# Patient Record
Sex: Female | Born: 1965
Health system: Southern US, Community
[De-identification: ages and names within clinical notes are randomized; demographics above are authoritative.]

## PROBLEM LIST (undated history)

## (undated) DIAGNOSIS — Z923 Personal history of irradiation: Secondary | ICD-10-CM

## (undated) DIAGNOSIS — E079 Disorder of thyroid, unspecified: Secondary | ICD-10-CM

## (undated) DIAGNOSIS — F419 Anxiety disorder, unspecified: Secondary | ICD-10-CM

## (undated) DIAGNOSIS — I10 Essential (primary) hypertension: Secondary | ICD-10-CM

## (undated) DIAGNOSIS — E559 Vitamin D deficiency, unspecified: Secondary | ICD-10-CM

## (undated) DIAGNOSIS — M2550 Pain in unspecified joint: Secondary | ICD-10-CM

## (undated) DIAGNOSIS — F32A Depression, unspecified: Secondary | ICD-10-CM

## (undated) DIAGNOSIS — E7800 Pure hypercholesterolemia, unspecified: Secondary | ICD-10-CM

## (undated) DIAGNOSIS — G4700 Insomnia, unspecified: Secondary | ICD-10-CM

## (undated) DIAGNOSIS — M1990 Unspecified osteoarthritis, unspecified site: Secondary | ICD-10-CM

## (undated) DIAGNOSIS — K219 Gastro-esophageal reflux disease without esophagitis: Secondary | ICD-10-CM

## (undated) DIAGNOSIS — E669 Obesity, unspecified: Secondary | ICD-10-CM

## (undated) DIAGNOSIS — E119 Type 2 diabetes mellitus without complications: Secondary | ICD-10-CM

## (undated) DIAGNOSIS — D649 Anemia, unspecified: Secondary | ICD-10-CM

## (undated) DIAGNOSIS — F329 Major depressive disorder, single episode, unspecified: Secondary | ICD-10-CM

## (undated) HISTORY — DX: Major depressive disorder, single episode, unspecified: F32.9

## (undated) HISTORY — DX: Anxiety disorder, unspecified: F41.9

## (undated) HISTORY — PX: TUBAL LIGATION: SHX77

## (undated) HISTORY — DX: Pain in unspecified joint: M25.50

## (undated) HISTORY — DX: Insomnia, unspecified: G47.00

## (undated) HISTORY — DX: Unspecified osteoarthritis, unspecified site: M19.90

## (undated) HISTORY — PX: ABDOMINAL HYSTERECTOMY: SHX81

## (undated) HISTORY — DX: Anemia, unspecified: D64.9

## (undated) HISTORY — DX: Vitamin D deficiency, unspecified: E55.9

## (undated) HISTORY — DX: Pure hypercholesterolemia, unspecified: E78.00

## (undated) HISTORY — DX: Obesity, unspecified: E66.9

## (undated) HISTORY — DX: Type 2 diabetes mellitus without complications: E11.9

## (undated) HISTORY — DX: Disorder of thyroid, unspecified: E07.9

## (undated) HISTORY — PX: OTHER SURGICAL HISTORY: SHX169

## (undated) HISTORY — DX: Essential (primary) hypertension: I10

## (undated) HISTORY — DX: Personal history of irradiation: Z92.3

## (undated) HISTORY — DX: Gastro-esophageal reflux disease without esophagitis: K21.9

## (undated) HISTORY — DX: Depression, unspecified: F32.A

---

## 2003-06-05 ENCOUNTER — Other Ambulatory Visit: Payer: Self-pay

## 2007-06-19 ENCOUNTER — Ambulatory Visit: Payer: Self-pay | Admitting: Family Medicine

## 2007-08-24 ENCOUNTER — Ambulatory Visit: Payer: Self-pay | Admitting: Family Medicine

## 2007-09-28 ENCOUNTER — Ambulatory Visit: Payer: Self-pay | Admitting: Specialist

## 2009-03-27 ENCOUNTER — Ambulatory Visit: Payer: Self-pay | Admitting: Family Medicine

## 2010-04-27 LAB — HM PAP SMEAR: HM PAP: NORMAL

## 2010-05-28 ENCOUNTER — Ambulatory Visit: Payer: Self-pay | Admitting: Family Medicine

## 2010-06-25 ENCOUNTER — Ambulatory Visit: Payer: Self-pay | Admitting: Family Medicine

## 2013-02-26 ENCOUNTER — Ambulatory Visit: Payer: Self-pay | Admitting: Obstetrics and Gynecology

## 2013-02-26 LAB — BASIC METABOLIC PANEL
Anion Gap: 3 — ABNORMAL LOW (ref 7–16)
BUN: 11 mg/dL (ref 7–18)
Co2: 30 mmol/L (ref 21–32)
EGFR (African American): >60
EGFR (Non-African Amer.): >60
Glucose: 82 mg/dL (ref 65–99)
Osmolality: 274 (ref 275–301)

## 2013-02-26 LAB — PREGNANCY, URINE: Pregnancy Test, Urine: NEGATIVE m[IU]/mL

## 2013-02-26 LAB — WBC: WBC: 8.5 10*3/uL (ref 3.6–11.0)

## 2013-02-26 LAB — HEMOGLOBIN: HGB: 11.8 g/dL — ABNORMAL LOW (ref 12.0–16.0)

## 2013-05-17 ENCOUNTER — Emergency Department: Payer: Self-pay | Admitting: Emergency Medicine

## 2013-05-19 LAB — BETA STREP CULTURE(ARMC)

## 2013-05-29 DIAGNOSIS — Z86018 Personal history of other benign neoplasm: Secondary | ICD-10-CM | POA: Insufficient documentation

## 2013-07-04 LAB — HM MAMMOGRAPHY: HM Mammogram: NORMAL

## 2013-07-15 DIAGNOSIS — Z9889 Other specified postprocedural states: Secondary | ICD-10-CM | POA: Insufficient documentation

## 2013-12-26 LAB — LIPID PANEL
Cholesterol: 214 mg/dL — AB (ref 0–200)
HDL: 93 mg/dL — AB (ref 35–70)
LDL CALC: 109 mg/dL
Triglycerides: 62 mg/dL (ref 40–160)

## 2014-07-10 ENCOUNTER — Ambulatory Visit (INDEPENDENT_AMBULATORY_CARE_PROVIDER_SITE_OTHER): Payer: BLUE CROSS/BLUE SHIELD | Admitting: Podiatry

## 2014-07-10 ENCOUNTER — Ambulatory Visit (INDEPENDENT_AMBULATORY_CARE_PROVIDER_SITE_OTHER): Payer: BLUE CROSS/BLUE SHIELD

## 2014-07-10 ENCOUNTER — Encounter: Payer: Self-pay | Admitting: Podiatry

## 2014-07-10 VITALS — BP 183/110 | HR 60 | Resp 16 | Ht 66.0 in | Wt 195.0 lb

## 2014-07-10 DIAGNOSIS — M8430XA Stress fracture, unspecified site, initial encounter for fracture: Secondary | ICD-10-CM

## 2014-07-10 DIAGNOSIS — M779 Enthesopathy, unspecified: Secondary | ICD-10-CM | POA: Diagnosis not present

## 2014-07-10 NOTE — Patient Instructions (Signed)
Metatarsal Stress Fracture When too much stress is put on the foot, as in running and jumping sports, the center shaft of the bones of the forefoot is very susceptible to stress fractures (break in bone). This is because of repetitive stress on the bone. This injury is more common if osteoporosis is present or if inadequate running shoes are used. Rapid increase in running distances are often the cause. Running distances should be gradually increased to avoid this problem. Shoes should be used which adequately cushion the foot. Shoes should absorb the shocks of the activity.  DIAGNOSIS  Usually the diagnosis is made by history. The foot progressively becomes sorer with activities. X-rays may be negative (show no break) within the first 2 to 3 weeks of the beginning of pain. A later X-ray may show signs of healing bone (callus formation). A bone scan or MRI will usually make the diagnosis earlier. TREATMENT AND HOME CARE INSTRUCTIONS  Treatment may or may not include a cast, removable fracture boot, or walking shoe. Casts are used for short periods of time to prevent muscle atrophy (muscle wasting).  Activities should be stopped until further advised by your caregiver.  Wear shoes with adequate shock absorbing abilities.  Alternative exercise may be undertaken while waiting for healing. These may include bicycling and swimming, or as your caregiver suggests. If you do not have a cast or splint:  You may walk on your injured foot as tolerated or advised.  Do not put any weight on your injured foot for as long as directed by your caregiver. Slowly increase the amount of time you walk on the foot as the pain allows or as advised.  Use crutches until you can bear weight without pain. A gradual increase in weight bearing may help.  Apply ice to the injury for 15-20 minutes each hour while awake for the first 2 days. Put the ice in a plastic bag and place a towel between the bag of ice and your  skin.  Only take over-the-counter or prescription medicines for pain, discomfort, or fever as directed by your caregiver. SEEK IMMEDIATE MEDICAL CARE IF:   Pain is becoming worse rather than better, or if pain is uncontrolled with medications.  You have increased swelling or redness in the foot. MAKE SURE YOU:   Understand these instructions.  Will watch your condition.  Will get help right away if you are not doing well or get worse. Document Released: 04/08/2000 Document Revised: 07/04/2011 Document Reviewed: 02/05/2008 [employer] Patient Information 2015 [employer]. This information is not intended to replace advice given to you by your health care provider. Make sure you discuss any questions you have with your health care provider.  

## 2014-07-10 NOTE — Progress Notes (Signed)
   Subjective:    Patient ID: Shelia Vasquez, female    DOB: [birth year], 49 y.o.   MRN: [id number]  HPI  49 year old female presents the office they with complaints of right foot pain and swelling which has been ongoing for the last 3 weeks. She states that prior to onset of symptoms she is started a new exercise program which she has been walking and uses a stationary bike. She states that she has had increased pain overlying the foot for which she points to the dorsal forefoot. She states that she has pain with walking and pressure. She's had no prior treatment. She also states that she has pain to her ankles upon direct pressure however she did not have any pain to her ankles with ambulation on a regular basis. She states that she has stiffness in her ankles which is mostly in the morning. She also states that she has stiffness in other joints it is her arms and hands. She is cruising seen her primary care physician for this was referred to rheumatology for which she has an upcoming coming appointment. No other complaints at this time.    Review of Systems  All other systems reviewed and are negative.      Objective:   Physical Exam AAO 3, NAD DP/PT pulses palpable, CRT less than 3 seconds Protective sensation intact with [place],, vibratory sensation intact, Achilles tendon reflex intact. There is tenderness to palpation overlying the right second metatarsal distally. There is no other area specific pinpoint bony tenderness or pain with vibratory sensation. There is moderate edema along the dorsal aspect of the right foot. There is no specific area pinpoint bony tenderness or pain with vibratory sensation overlying the ankle bilaterally. Ankle joint range of motion is intact. No edema to the ankles bilaterally. MMT tightly decreased on the right due to pain. ROM WNL No open lesions or pre-ulcer lesions identified. No pain with calf compression, swelling, warmth,  erythema.       Assessment & Plan:  49 year old female right second metatarsal stress fracture; multiple joint stiffness -X-rays were obtained and reviewed. X-rays revealed periosteal reaction overlying the second metatarsal distally. -Treatment options were discussed the patient including alternatives, risks, complications. -Given clinical and x-ray findings recommend immobilization. CAM walker was dispensed. Can ambulate as tolerated in the cam boot. -For the multiple joint pain and morning stiffness recommend follow-up with rheumatology. -Follow-up in 2 weeks for repeat x-rays or sooner if any problems are to arise. In the meantime encouraged to call the office with any questions, concerns, changes symptoms.

## 2014-07-17 ENCOUNTER — Ambulatory Visit: Payer: Self-pay | Admitting: Podiatry

## 2014-07-24 ENCOUNTER — Ambulatory Visit: Payer: BLUE CROSS/BLUE SHIELD | Admitting: Podiatry

## 2014-10-09 ENCOUNTER — Other Ambulatory Visit: Payer: Self-pay | Admitting: Family Medicine

## 2014-10-10 NOTE — Telephone Encounter (Signed)
Patient requesting refill. 

## 2014-11-12 ENCOUNTER — Other Ambulatory Visit: Payer: Self-pay | Admitting: Family Medicine

## 2014-11-13 NOTE — Telephone Encounter (Signed)
Patient requesting refill. 

## 2015-02-01 ENCOUNTER — Other Ambulatory Visit: Payer: Self-pay | Admitting: Family Medicine

## 2015-02-02 NOTE — Telephone Encounter (Signed)
Left voice message for her to return call to schedule appointment

## 2015-02-04 NOTE — Telephone Encounter (Signed)
Appointment made for 02-06-15

## 2015-02-06 ENCOUNTER — Ambulatory Visit (INDEPENDENT_AMBULATORY_CARE_PROVIDER_SITE_OTHER): Payer: BLUE CROSS/BLUE SHIELD | Admitting: Family Medicine

## 2015-02-06 ENCOUNTER — Encounter: Payer: Self-pay | Admitting: Family Medicine

## 2015-02-06 VITALS — BP 122/86 | HR 64 | Temp 98.7°F | Resp 16 | Ht 66.0 in | Wt 195.2 lb

## 2015-02-06 DIAGNOSIS — Z862 Personal history of diseases of the blood and blood-forming organs and certain disorders involving the immune mechanism: Secondary | ICD-10-CM | POA: Diagnosis not present

## 2015-02-06 DIAGNOSIS — E785 Hyperlipidemia, unspecified: Secondary | ICD-10-CM | POA: Diagnosis not present

## 2015-02-06 DIAGNOSIS — F418 Other specified anxiety disorders: Secondary | ICD-10-CM

## 2015-02-06 DIAGNOSIS — E890 Postprocedural hypothyroidism: Secondary | ICD-10-CM | POA: Diagnosis not present

## 2015-02-06 DIAGNOSIS — F329 Major depressive disorder, single episode, unspecified: Secondary | ICD-10-CM

## 2015-02-06 DIAGNOSIS — G4700 Insomnia, unspecified: Secondary | ICD-10-CM

## 2015-02-06 DIAGNOSIS — J302 Other seasonal allergic rhinitis: Secondary | ICD-10-CM

## 2015-02-06 DIAGNOSIS — Z23 Encounter for immunization: Secondary | ICD-10-CM | POA: Diagnosis not present

## 2015-02-06 DIAGNOSIS — Z923 Personal history of irradiation: Secondary | ICD-10-CM | POA: Insufficient documentation

## 2015-02-06 DIAGNOSIS — K5909 Other constipation: Secondary | ICD-10-CM | POA: Insufficient documentation

## 2015-02-06 DIAGNOSIS — E669 Obesity, unspecified: Secondary | ICD-10-CM | POA: Insufficient documentation

## 2015-02-06 DIAGNOSIS — Z114 Encounter for screening for human immunodeficiency virus [HIV]: Secondary | ICD-10-CM | POA: Diagnosis not present

## 2015-02-06 DIAGNOSIS — K219 Gastro-esophageal reflux disease without esophagitis: Secondary | ICD-10-CM

## 2015-02-06 DIAGNOSIS — E559 Vitamin D deficiency, unspecified: Secondary | ICD-10-CM | POA: Diagnosis not present

## 2015-02-06 DIAGNOSIS — F32A Depression, unspecified: Secondary | ICD-10-CM

## 2015-02-06 DIAGNOSIS — K5900 Constipation, unspecified: Secondary | ICD-10-CM

## 2015-02-06 DIAGNOSIS — F419 Anxiety disorder, unspecified: Secondary | ICD-10-CM

## 2015-02-06 DIAGNOSIS — I10 Essential (primary) hypertension: Secondary | ICD-10-CM | POA: Insufficient documentation

## 2015-02-06 DIAGNOSIS — Z90710 Acquired absence of both cervix and uterus: Secondary | ICD-10-CM | POA: Insufficient documentation

## 2015-02-06 MED ORDER — FLUTICASONE PROPIONATE 50 MCG/ACT NA SUSP: 2.0000 | Freq: Every day | NASAL | Status: DC

## 2015-02-06 MED ORDER — LINACLOTIDE 145 MCG PO CAPS: 145.0000 ug | ORAL_CAPSULE | Freq: Every day | ORAL | Status: DC

## 2015-02-06 MED ORDER — CLONIDINE HCL 0.1 MG PO TABS: 0.1000 mg | ORAL_TABLET | Freq: Every evening | ORAL | Status: DC

## 2015-02-06 MED ORDER — PRAVASTATIN SODIUM 40 MG PO TABS: 40.0000 mg | ORAL_TABLET | Freq: Every day | ORAL | Status: DC

## 2015-02-06 MED ORDER — ATENOLOL 50 MG PO TABS: 50.0000 mg | ORAL_TABLET | Freq: Every day | ORAL | Status: DC

## 2015-02-06 MED ORDER — AMLODIPINE BESYLATE 5 MG PO TABS: 5.0000 mg | ORAL_TABLET | Freq: Every day | ORAL | Status: DC

## 2015-02-06 MED ORDER — TEMAZEPAM 15 MG PO CAPS: 15.0000 mg | ORAL_CAPSULE | Freq: Every evening | ORAL | Status: DC | PRN

## 2015-02-06 NOTE — Progress Notes (Signed)
Name: Shelia Vasquez   MRN: [id number]    DOB: [birth year]   Date:02/06/2015       Progress Note  Subjective  Chief Complaint  Chief Complaint  Patient presents with  . Medication Refill  . Hypertension  . Hyperlipidemia  . Hypothyroidism    HPI  HTN: taking medication and denies side effects of medication, no chest pain, no palpitation.   Hyperlipidemia: taking Pravastatin, she has some muscle aches, but not sure if related to medication.   Hypothyroidism: s/p thyroid ablation. Taking Synthroid, she has noticed some hair loss, she also has constipation, mild dry skin  Seasonal allergic rhinitis: she has congestion this time of the year, also itchy nose and rhinorrhea, she does not take otc medication because of bp , states she responded well on Fluticasone in the past  Chronic Constipation: she states had it for many years, worse over the past 6 years. Tried multiple laxatives at home, and also Miralax without improvement. She has occasional abdominal pain, no blood in stools, no family history of colon cancer. She needs to strain, bowel movements with medication about once every two weeks.   Insomnia: not on medication , but has difficulty falling asleep and staying asleep at least 4 times weekly. She snores, feels tired during the day, has a lot of nasal congestion   Patient Active Problem List   Diagnosis Date Noted  . Anxiety and depression 02/06/2015  . Benign hypertension 02/06/2015  . Dyslipidemia 02/06/2015  . Insomnia 02/06/2015  . Arthralgia 02/06/2015  . Gastro-esophageal reflux disease without esophagitis 02/06/2015  . History of radioactive iodine thyroid ablation 02/06/2015  . Hypothyroidism, postablative 02/06/2015  . History of iron deficiency anemia 02/06/2015  . Obesity (BMI 30.0-34.9) 02/06/2015  . H/O: hysterectomy 02/06/2015  . Vitamin D deficiency 02/06/2015  . Seasonal allergic rhinitis 02/06/2015  . Post-operative state 07/15/2013  . History of  uterine fibroid 05/29/2013    Past Surgical History  Procedure Laterality Date  . Abdominal hysterectomy    . Cesarean section    . Btl    . Tubal ligation      History reviewed. No pertinent family history.  Social History   Social History  . Marital Status: Married    Spouse Name: N/A  . Number of Children: N/A  . Years of Education: N/A   Occupational History  . Not on file.   Social History Main Topics  . Smoking status: Never Smoker   . Smokeless tobacco: Never Used  . Alcohol Use: No  . Drug Use: No  . Sexual Activity:    Partners: Male   Other Topics Concern  . Not on file   Social History Narrative     Current outpatient prescriptions:  .  aspirin 81 MG tablet, Take 1 tablet by mouth daily., Disp: , Rfl:  .  loratadine (CLARITIN) 10 MG tablet, CLARITIN, 10MG  (Oral Tablet)  one po qday for 0 days  Quantity: 1.00;  Refills: 0   Ordered :27-Apr-2010  Shelia CorySOWLES, Adabelle Griffiths MD;  Started 05-Mar-2009 Active, Disp: , Rfl:  .  amLODipine (NORVASC) 5 MG tablet, Take 1 tablet (5 mg total) by mouth daily., Disp: 90 tablet, Rfl: 1 .  atenolol (TENORMIN) 50 MG tablet, Take 1 tablet (50 mg total) by mouth daily., Disp: 90 tablet, Rfl: 1 .  cloNIDine (CATAPRES) 0.1 MG tablet, Take 1 tablet (0.1 mg total) by mouth every evening., Disp: 90 tablet, Rfl: 1 .  fluticasone (FLONASE) 50 MCG/ACT nasal spray, Place  2 sprays into both nostrils daily., Disp: 16 g, Rfl: 6 .  levothyroxine (SYNTHROID, LEVOTHROID) 112 MCG tablet, TAKE 1 TABLET BY MOUTH EVERY DAY (PLEASE SCHEDULE APPT WITH OFFICE), Disp: 90 tablet, Rfl: 0 .  pravastatin (PRAVACHOL) 40 MG tablet, Take 1 tablet (40 mg total) by mouth daily., Disp: 90 tablet, Rfl: 1 .  ranitidine (ZANTAC) 150 MG capsule, Take by mouth., Disp: , Rfl:  .  temazepam (RESTORIL) 15 MG capsule, Take 1 capsule (15 mg total) by mouth at bedtime as needed for sleep., Disp: 30 capsule, Rfl: 2  No Known Allergies   ROS  Ten systems reviewed and is  negative except as mentioned in HPI  Objective  Filed Vitals:   02/06/15 1045  BP: 122/86  Pulse: 64  Temp: 98.7 F (37.1 C)  TempSrc: Oral  Resp: 16  Height:  (1.676 m)  Weight: 195 lb 3.2 oz (88.542 kg)  SpO2: 97%    Body mass index is 31.52 kg/(m^2).  Physical Exam  Constitutional: Patient appears well-developed  Obese  No distress.  HEENT: head atraumatic, normocephalic, pupils equal and reactive to light,  neck supple, throat within normal limits. Boggy turbinates Cardiovascular: Normal rate, regular rhythm and normal heart sounds.  No murmur heard. No BLE edema. Pulmonary/Chest: Effort normal and breath sounds normal. No respiratory distress. Abdominal: Soft.  There is no tenderness. Psychiatric: Patient has a normal mood and affect. behavior is normal. Judgment and thought content normal.  PHQ2/9: Depression screen PHQ 2/9 02/06/2015  Decreased Interest 0  Down, Depressed, Hopeless 0  PHQ - 2 Score 0    Fall Risk: Fall Risk  02/06/2015  Falls in the past year? No    Functional Status Survey: Is the patient deaf or have difficulty hearing?: No Does the patient have difficulty seeing, even when wearing glasses/contacts?: Yes (glasses) Does the patient have difficulty concentrating, remembering, or making decisions?: No Does the patient have difficulty walking or climbing stairs?: No Does the patient have difficulty dressing or bathing?: No Does the patient have difficulty doing errands alone such as visiting a doctor's office or shopping?: No    Assessment & Plan  1. Benign hypertension  At goal - amLODipine (NORVASC) 5 MG tablet; Take 1 tablet (5 mg total) by mouth daily.  Dispense: 90 tablet; Refill: 1 - atenolol (TENORMIN) 50 MG tablet; Take 1 tablet (50 mg total) by mouth daily.  Dispense: 90 tablet; Refill: 1 - cloNIDine (CATAPRES) 0.1 MG tablet; Take 1 tablet (0.1 mg total) by mouth every evening.  Dispense: 90 tablet; Refill: 1  2. Needs flu  shot  - Flu Vaccine QUAD 36+ mos PF IM (Fluarix & Fluzone Quad PF)  3. Gastro-esophageal reflux disease without esophagitis  Under control on prn medicatoin   4. Hypothyroidism, postablative  -TSH  5. Anxiety and depression  Doing well, no symptoms now  6. Dyslipidemia  - pravastatin (PRAVACHOL) 40 MG tablet; Take 1 tablet (40 mg total) by mouth daily.  Dispense: 90 tablet; Refill: 1 - Comprehensive metabolic panel - Lipid panel  7. History of iron deficiency anemia  - CBC with Differential/Platelet  8. Vitamin D deficiency  - Vit D  25 hydroxy (rtn osteoporosis monitoring)  9. Encounter for screening for HIV  - HIV antibody  10. Seasonal allergic rhinitis  - fluticasone (FLONASE) 50 MCG/ACT nasal spray; Place 2 sprays into both nostrils daily.  Dispense: 16 g; Refill: 6  11. Insomnia  - temazepam (RESTORIL) 15 MG capsule; Take 1  capsule (15 mg total) by mouth at bedtime as needed for sleep.  Dispense: 30 capsule; Refill: 2  12. Chronic constipation  Failed otc and Miralax we will try Linzess, caused possible side effect - Linaclotide (LINZESS) 145 MCG CAPS capsule; Take 1 capsule (145 mcg total) by mouth daily.  Dispense: 30 capsule; Refill: 5

## 2015-04-01 ENCOUNTER — Encounter: Payer: Self-pay | Admitting: Family Medicine

## 2015-04-01 ENCOUNTER — Ambulatory Visit (INDEPENDENT_AMBULATORY_CARE_PROVIDER_SITE_OTHER): Payer: BLUE CROSS/BLUE SHIELD | Admitting: Family Medicine

## 2015-04-01 VITALS — BP 122/86 | HR 84 | Temp 98.6°F | Resp 18 | Ht 66.0 in | Wt 194.5 lb

## 2015-04-01 DIAGNOSIS — R002 Palpitations: Secondary | ICD-10-CM | POA: Diagnosis not present

## 2015-04-01 DIAGNOSIS — M5410 Radiculopathy, site unspecified: Secondary | ICD-10-CM | POA: Diagnosis not present

## 2015-04-01 DIAGNOSIS — R072 Precordial pain: Secondary | ICD-10-CM

## 2015-04-01 DIAGNOSIS — R42 Dizziness and giddiness: Secondary | ICD-10-CM | POA: Diagnosis not present

## 2015-04-01 DIAGNOSIS — I10 Essential (primary) hypertension: Secondary | ICD-10-CM | POA: Diagnosis not present

## 2015-04-01 LAB — POCT URINALYSIS DIPSTICK
BILIRUBIN UA: NEGATIVE
Glucose, UA: NEGATIVE
KETONES UA: NEGATIVE
Leukocytes, UA: NEGATIVE
Nitrite, UA: NEGATIVE
PH UA: 5.5
PROTEIN UA: NEGATIVE
RBC UA: NEGATIVE
Spec Grav, UA: 1.02
Urobilinogen, UA: 0.2

## 2015-04-01 MED ORDER — METOPROLOL SUCCINATE ER 50 MG PO TB24: 50.0000 mg | ORAL_TABLET | Freq: Every day | ORAL | Status: DC

## 2015-04-01 MED ORDER — CYCLOBENZAPRINE HCL 10 MG PO TABS: 10.0000 mg | ORAL_TABLET | Freq: Three times a day (TID) | ORAL | Status: DC | PRN

## 2015-04-01 MED ORDER — METOPROLOL SUCCINATE ER 25 MG PO TB24: 25.0000 mg | ORAL_TABLET | Freq: Every day | ORAL | Status: DC

## 2015-04-01 NOTE — Patient Instructions (Signed)

## 2015-04-01 NOTE — Progress Notes (Signed)
Name: Shelia Vasquez   MRN: [id number]    DOB: 1965-05-17   Date:04/01/2015       Progress Note  Subjective  Chief Complaint  Chief Complaint  Patient presents with  . Back Pain    Onset couple of weeks, intermittent, lower back and radiates to side area and left arm-having numbness down arm. Stabbing, aching pain.  . Dizziness    Intermittently for the past couple of months, Amlodipine was making patient have bilateral ankle edema so patient stopped medication. Patient has noticed when she stands up too fast she will get dizzy.    HPI  Orthostatic dizziness: she was switched from Norvasc to Atenolol because of leg swelling back in September, since than she has noticed dizziness when she stands up. No syncope. Symptoms resolves within seconds. She states not difficulty walking while dizzy. No association with chest pain or palpitation. She takes Atenolol and Clonidine at night, no bp medications during the day.   Back pain: symptoms started gradually over the past couple of weeks. She has a history of similar symptoms in the past. Pain is intermittent. It is described as sharp/stabbing on lower mid back, and sometimes radiates to left flank area. No hematuria, no urinary frequency or dysuria. Occasionally has urinary urgency - but not a new symptoms. No fever, chills or rash. No change in bowel movements - constipation is controlled with Linzess.   Palpitation: happening at the end of the day, before she takes Atenolol in the evening. Symptoms resolves with Atenolol. She has hypothyroidism but did not get labs done on her last visit. She will go there today to get it drawn. Explained there are many reasons for palpitation, including hyperthyroidism, rhythm problems, anemia.  Chest pain: intermittent, started a couple of months ago. Symptoms are almost daily, lasts about 20 minutes, described as tightness, no association with diaphoresis or nausea, the pain does not radiate.    Patient  Active Problem List   Diagnosis Date Noted  . Anxiety and depression 02/06/2015  . Benign hypertension 02/06/2015  . Dyslipidemia 02/06/2015  . Insomnia 02/06/2015  . Arthralgia 02/06/2015  . Gastro-esophageal reflux disease without esophagitis 02/06/2015  . History of radioactive iodine thyroid ablation 02/06/2015  . Hypothyroidism, postablative 02/06/2015  . History of iron deficiency anemia 02/06/2015  . Obesity (BMI 30.0-34.9) 02/06/2015  . H/O: hysterectomy 02/06/2015  . Vitamin D deficiency 02/06/2015  . Seasonal allergic rhinitis 02/06/2015  . Chronic constipation 02/06/2015  . Post-operative state 07/15/2013  . History of uterine fibroid 05/29/2013    Past Surgical History  Procedure Laterality Date  . Abdominal hysterectomy    . Cesarean section    . Btl    . Tubal ligation      History reviewed. No pertinent family history.  Social History   Social History  . Marital Status: Married    Spouse Name: N/A  . Number of Children: N/A  . Years of Education: N/A   Occupational History  . Not on file.   Social History Main Topics  . Smoking status: Never Smoker   . Smokeless tobacco: Never Used  . Alcohol Use: No  . Drug Use: No  . Sexual Activity:    Partners: Male   Other Topics Concern  . Not on file   Social History Narrative     Current outpatient prescriptions:  .  aspirin 81 MG tablet, Take 1 tablet by mouth daily., Disp: , Rfl:  .  cloNIDine (CATAPRES) 0.1 MG tablet, Take  1 tablet (0.1 mg total) by mouth every evening., Disp: 90 tablet, Rfl: 1 .  fluticasone (FLONASE) 50 MCG/ACT nasal spray, Place 2 sprays into both nostrils daily., Disp: 16 g, Rfl: 6 .  levothyroxine (SYNTHROID, LEVOTHROID) 112 MCG tablet, TAKE 1 TABLET BY MOUTH EVERY DAY (PLEASE SCHEDULE APPT WITH OFFICE), Disp: 90 tablet, Rfl: 0 .  Linaclotide (LINZESS) 145 MCG CAPS capsule, Take 1 capsule (145 mcg total) by mouth daily., Disp: 30 capsule, Rfl: 5 .  loratadine (CLARITIN) 10  MG tablet, CLARITIN,  (Oral Tablet)  one po qday for 0 days  Quantity: 1.00;  Refills: 0   Ordered :27-Apr-2010  Alba Cory MD;  Started 05-Mar-2009 Active, Disp: , Rfl:  .  ranitidine (ZANTAC) 150 MG capsule, Take by mouth., Disp: , Rfl:  .  metoprolol succinate (TOPROL-XL) 50 MG 24 hr tablet, Take 1 tablet (50 mg total) by mouth daily. Take with or immediately following a meal., Disp: 90 tablet, Rfl: 0  No Known Allergies   ROS  Constitutional: Negative for fever or weight change.  Respiratory: Negative for cough and shortness of breath.   Cardiovascular: Positive for chest pain - intermittent left side, usually when in bed, positive for some  palpitations.  Gastrointestinal: Negative for abdominal pain, no bowel changes.  Musculoskeletal: Negative for gait problem or joint swelling.  Skin: Negative for rash.  Neurological: Positive for dizziness no  headache.  No other specific complaints in a complete review of systems (except as listed in HPI above).  Objective  Filed Vitals:   04/01/15 1000  BP: 122/86  Pulse: 84  Temp: 98.6 F (37 C)  TempSrc: Oral  Resp: 18  Height:  (1.676 m)  Weight: 194 lb 8 oz (88.225 kg)  SpO2: 96%    Body mass index is 31.41 kg/(m^2).  Physical Exam  Constitutional: Patient appears well-developed and well-nourished. Obese No distress.  HEENT: head atraumatic, normocephalic, pupils equal and reactive to light neck supple, throat within normal limits Cardiovascular: Normal rate, regular rhythm and normal heart sounds.  No murmur heard. No BLE edema. Pulmonary/Chest: Effort normal and breath sounds normal. No respiratory distress. Abdominal: Soft.  There is no tenderness. Psychiatric: Patient has a normal mood and affect. behavior is normal. Judgment and thought content normal.  Recent Results (from the past 2160 hour(s))  POCT Urinalysis Dipstick     Status: Normal   Collection Time: 04/01/15 10:04 AM  Result Value Ref Range    Color, UA yellow    Clarity, UA clear    Glucose, UA neg    Bilirubin, UA neg    Ketones, UA neg    Spec Grav, UA 1.020    Blood, UA neg    pH, UA 5.5    Protein, UA neg    Urobilinogen, UA 0.2    Nitrite, UA neg    Leukocytes, UA Negative Negative     PHQ2/9: Depression screen PHQ 2/9 02/06/2015  Decreased Interest 0  Down, Depressed, Hopeless 0  PHQ - 2 Score 0     Fall Risk: Fall Risk  02/06/2015  Falls in the past year? No    Assessment & Plan  1. Acute low back pain with radicular symptoms, duration less than 6 weeks  Seems to be muscular skeletal, we will give her home exercises for her back, muscle relaxer and if worsening a prednisone taper - POCT Urinalysis Dipstick We will try flexeril , start qhs and see how she tolerates #30 no refills  2. Benign hypertension  Switch to Toprol XL 25 mg since she has bradycardia  and monitor -Toprol XL 25 mg one daily #30 and no refills, return in one month  3. Orthostatic dizziness  Advised to get up slowly, bp is not that low right now, symptoms are mild  4. Palpitation  Change from Atenolol to Toprol, needs to get labs done  5. Precordial pain  No significant family history, discussed holding off to find out what TSH and CBC looks like but she is very anxious about it and would like referral now to cardiology - Ambulatory referral to Cardiology EKG - bradycardia rate of 50 and non-specific ST changes

## 2015-04-01 NOTE — Addendum Note (Signed)
Addended by: Marcos EkeGRAVES, Mahoganie Basher C on: 04/01/2015 11:11 AM   Modules accepted: Orders

## 2015-04-02 LAB — COMPREHENSIVE METABOLIC PANEL
ALBUMIN: 4.4 g/dL (ref 3.5–5.5)
ALK PHOS: 91 IU/L (ref 39–117)
ALT: 15 IU/L (ref 0–32)
AST: 23 IU/L (ref 0–40)
Albumin/Globulin Ratio: 1.3 (ref 1.1–2.5)
BILIRUBIN TOTAL: 0.2 mg/dL (ref 0.0–1.2)
BUN / CREAT RATIO: 18 (ref 9–23)
BUN: 13 mg/dL (ref 6–24)
CO2: 26 mmol/L (ref 18–29)
Calcium: 9.8 mg/dL (ref 8.7–10.2)
Chloride: 97 mmol/L (ref 97–106)
Creatinine, Ser: 0.73 mg/dL (ref 0.57–1.00)
GFR calc Af Amer: 113 mL/min/{1.73_m2} (ref >59)
GFR calc non Af Amer: 98 mL/min/{1.73_m2} (ref >59)
GLOBULIN, TOTAL: 3.5 g/dL (ref 1.5–4.5)
Glucose: 90 mg/dL (ref 65–99)
POTASSIUM: 3.6 mmol/L (ref 3.5–5.2)
SODIUM: 141 mmol/L (ref 136–144)
Total Protein: 7.9 g/dL (ref 6.0–8.5)

## 2015-04-02 LAB — CBC WITH DIFFERENTIAL/PLATELET
BASOS ABS: 0 10*3/uL (ref 0.0–0.2)
Basos: 0 %
EOS (ABSOLUTE): 0.1 10*3/uL (ref 0.0–0.4)
Eos: 2 %
HEMOGLOBIN: 11.8 g/dL (ref 11.1–15.9)
Hematocrit: 35.6 % (ref 34.0–46.6)
IMMATURE GRANS (ABS): 0 10*3/uL (ref 0.0–0.1)
Immature Granulocytes: 0 %
LYMPHS: 42 %
Lymphocytes Absolute: 3 10*3/uL (ref 0.7–3.1)
MCH: 25.7 pg — ABNORMAL LOW (ref 26.6–33.0)
MCHC: 33.1 g/dL (ref 31.5–35.7)
MCV: 77 fL — ABNORMAL LOW (ref 79–97)
MONOCYTES: 7 %
Monocytes Absolute: 0.5 10*3/uL (ref 0.1–0.9)
Neutrophils Absolute: 3.5 10*3/uL (ref 1.4–7.0)
Neutrophils: 49 %
Platelets: 368 10*3/uL (ref 150–379)
RBC: 4.6 x10E6/uL (ref 3.77–5.28)
RDW: 15.2 % (ref 12.3–15.4)
WBC: 7.2 10*3/uL (ref 3.4–10.8)

## 2015-04-02 LAB — LIPID PANEL
CHOLESTEROL TOTAL: 220 mg/dL — AB (ref 100–199)
Chol/HDL Ratio: 2.1 ratio units (ref 0.0–4.4)
HDL: 105 mg/dL (ref >39)
LDL CALC: 99 mg/dL (ref 0–99)
Triglycerides: 79 mg/dL (ref 0–149)
VLDL CHOLESTEROL CAL: 16 mg/dL (ref 5–40)

## 2015-04-02 LAB — TSH: TSH: 0.616 u[IU]/mL (ref 0.450–4.500)

## 2015-04-02 LAB — VITAMIN D 25 HYDROXY (VIT D DEFICIENCY, FRACTURES): Vit D, 25-Hydroxy: 13.6 ng/mL — ABNORMAL LOW (ref 30.0–100.0)

## 2015-04-02 LAB — HIV ANTIBODY (ROUTINE TESTING W REFLEX): HIV SCREEN 4TH GENERATION: NONREACTIVE

## 2015-04-10 ENCOUNTER — Telehealth: Payer: Self-pay

## 2015-04-10 NOTE — Telephone Encounter (Signed)
Unable to leave vm due to voicemail box was not set up.

## 2015-04-10 NOTE — Telephone Encounter (Signed)
-----   Message from Krichna Sowles, MD sent at 04/05/2015  9:42 PM EST ----- Sugar , kidney and liver functions are within normal limits Vitamin D is low, I will send prescription vitamin D to  pharmacy and once finished she/he needs to take otc vitamin D 2000 units daily HIV negative TSH at goal, continue current dose of Synthroid No anemia, but RBC size is small and she may benefit from taking otc ferrous sulfate Lipid panel is at goal for her. Based on the results of lipid panel his cardiovascular risk factor in the next 10 years is : 1.0 no need for statin therapy at this time 

## 2015-04-13 ENCOUNTER — Telehealth: Payer: Self-pay

## 2015-04-13 NOTE — Telephone Encounter (Signed)
Called patient  regardings labs but was not available and vm was not set up. Mailed lab results to patient.

## 2015-04-13 NOTE — Telephone Encounter (Signed)
-----   Message from Alba CoryKrichna Sowles, MD sent at 04/05/2015  9:42 PM EST ----- Sugar , kidney and liver functions are within normal limits Vitamin D is low, I will send prescription vitamin D to  pharmacy and once finished she/he needs to take otc vitamin D 2000 units daily HIV negative TSH at goal, continue current dose of Synthroid No anemia, but RBC size is small and she may benefit from taking otc ferrous sulfate Lipid panel is at goal for her. Based on the results of lipid panel his cardiovascular risk factor in the next 10 years is : 1.0 no need for statin therapy at this time

## 2015-05-07 ENCOUNTER — Other Ambulatory Visit: Payer: Self-pay | Admitting: Family Medicine

## 2015-05-10 ENCOUNTER — Other Ambulatory Visit: Payer: Self-pay | Admitting: Family Medicine

## 2015-05-11 ENCOUNTER — Other Ambulatory Visit: Payer: Self-pay | Admitting: Family Medicine

## 2015-05-11 NOTE — Telephone Encounter (Signed)
Patient requesting refill. 

## 2015-05-12 ENCOUNTER — Ambulatory Visit: Payer: BLUE CROSS/BLUE SHIELD | Admitting: Family Medicine

## 2015-05-28 ENCOUNTER — Ambulatory Visit: Payer: Self-pay | Admitting: Cardiovascular Disease

## 2015-05-28 ENCOUNTER — Encounter: Payer: Self-pay | Admitting: *Deleted

## 2015-06-09 ENCOUNTER — Other Ambulatory Visit: Payer: Self-pay | Admitting: Family Medicine

## 2015-06-09 NOTE — Telephone Encounter (Signed)
Tried to inform patient but voice mail not set up yet

## 2015-07-17 ENCOUNTER — Encounter: Payer: Self-pay | Admitting: Family Medicine

## 2015-07-17 ENCOUNTER — Ambulatory Visit (INDEPENDENT_AMBULATORY_CARE_PROVIDER_SITE_OTHER): Payer: BLUE CROSS/BLUE SHIELD | Admitting: Family Medicine

## 2015-07-17 VITALS — BP 124/72 | HR 73 | Temp 98.6°F | Resp 16 | Ht 64.0 in | Wt 202.9 lb

## 2015-07-17 DIAGNOSIS — R002 Palpitations: Secondary | ICD-10-CM | POA: Diagnosis not present

## 2015-07-17 DIAGNOSIS — E559 Vitamin D deficiency, unspecified: Secondary | ICD-10-CM

## 2015-07-17 DIAGNOSIS — R1031 Right lower quadrant pain: Secondary | ICD-10-CM | POA: Diagnosis not present

## 2015-07-17 DIAGNOSIS — M545 Low back pain, unspecified: Secondary | ICD-10-CM

## 2015-07-17 DIAGNOSIS — R61 Generalized hyperhidrosis: Secondary | ICD-10-CM

## 2015-07-17 DIAGNOSIS — E890 Postprocedural hypothyroidism: Secondary | ICD-10-CM | POA: Diagnosis not present

## 2015-07-17 DIAGNOSIS — I10 Essential (primary) hypertension: Secondary | ICD-10-CM

## 2015-07-17 LAB — POCT URINALYSIS DIPSTICK
Bilirubin, UA: NEGATIVE
Glucose, UA: NEGATIVE
Ketones, UA: NEGATIVE
Leukocytes, UA: NEGATIVE
Nitrite, UA: NEGATIVE
RBC UA: NEGATIVE
SPEC GRAV UA: 1.015
UROBILINOGEN UA: NEGATIVE
pH, UA: 6.5

## 2015-07-17 MED ORDER — METOPROLOL SUCCINATE ER 25 MG PO TB24: 25.0000 mg | ORAL_TABLET | Freq: Every day | ORAL | Status: DC

## 2015-07-17 MED ORDER — CLONIDINE HCL 0.1 MG PO TABS: 0.1000 mg | ORAL_TABLET | Freq: Every evening | ORAL | Status: DC

## 2015-07-17 MED ORDER — VITAMIN D (ERGOCALCIFEROL) 1.25 MG (50000 UNIT) PO CAPS: 50000.0000 [IU] | ORAL_CAPSULE | ORAL | Status: DC

## 2015-07-17 MED ORDER — LEVOTHYROXINE SODIUM 112 MCG PO TABS: 112.0000 ug | ORAL_TABLET | Freq: Every day | ORAL | Status: DC

## 2015-07-17 NOTE — Progress Notes (Signed)
Name: Shelia Vasquez   MRN: [id number]    DOB: 1965/05/06   Date:07/17/2015       Progress Note  Subjective  Chief Complaint  Chief Complaint  Patient presents with  . Back Pain    Onset-1 and half weeks   . Hypothyroidism  . Hypertension    HPI  Back pain: recurrent. She was seen in December with similar symptoms. Pain improved with Flexeril. She states pain is described as cramping like right low back pain that radiates to the right iliac crest. No dysuria, hematuria or urinary frequency. No problems walking. She works sitting all day and is not physically active.  HTN: taking Clonidine at night and Toprol XL 25 mg in place of Atenolol. Palpitations and chest pain have resolved, she still occasional dizziness/orthostatic changes, but has improved. She missed appointment with cardiologist - she forgot because symptoms resolved with medication adjustment  Hypothyroidism: taking medication daily, no change in bowel movements ( still takes Linzess as needed ), always has dry skin, no fatigue, she has gained some weight - but she states it is because she is not following a healthy diet.    Patient Active Problem List   Diagnosis Date Noted  . Anxiety and depression 02/06/2015  . Benign hypertension 02/06/2015  . Dyslipidemia 02/06/2015  . Insomnia 02/06/2015  . Arthralgia 02/06/2015  . Gastro-esophageal reflux disease without esophagitis 02/06/2015  . History of radioactive iodine thyroid ablation 02/06/2015  . Hypothyroidism, postablative 02/06/2015  . History of iron deficiency anemia 02/06/2015  . Obesity (BMI 30.0-34.9) 02/06/2015  . H/O: hysterectomy 02/06/2015  . Vitamin D deficiency 02/06/2015  . Seasonal allergic rhinitis 02/06/2015  . Chronic constipation 02/06/2015  . Post-operative state 07/15/2013  . History of uterine fibroid 05/29/2013    Past Surgical History  Procedure Laterality Date  . Abdominal hysterectomy    . Cesarean section    . Btl    . Tubal  ligation      No family history on file.  Social History   Social History  . Marital Status: Married    Spouse Name: N/A  . Number of Children: N/A  . Years of Education: N/A   Occupational History  . Not on file.   Social History Main Topics  . Smoking status: Never Smoker   . Smokeless tobacco: Never Used  . Alcohol Use: No  . Drug Use: No  . Sexual Activity:    Partners: Male   Other Topics Concern  . Not on file   Social History Narrative     Current outpatient prescriptions:  .  aspirin 81 MG tablet, Take 1 tablet by mouth daily., Disp: , Rfl:  .  cloNIDine (CATAPRES) 0.1 MG tablet, Take 1 tablet (0.1 mg total) by mouth every evening., Disp: 90 tablet, Rfl: 1 .  cyclobenzaprine (FLEXERIL) 10 MG tablet, Take 1 tablet (10 mg total) by mouth 3 (three) times daily as needed for muscle spasms., Disp: 30 tablet, Rfl: 0 .  fluticasone (FLONASE) 50 MCG/ACT nasal spray, Place 2 sprays into both nostrils daily., Disp: 16 g, Rfl: 6 .  levothyroxine (SYNTHROID, LEVOTHROID) 112 MCG tablet, Take 1 tablet (112 mcg total) by mouth daily before breakfast., Disp: 90 tablet, Rfl: 0 .  Linaclotide (LINZESS) 145 MCG CAPS capsule, Take 1 capsule (145 mcg total) by mouth daily., Disp: 30 capsule, Rfl: 5 .  loratadine (CLARITIN) 10 MG tablet, CLARITIN, 10MG  (Oral Tablet)  one po qday for 0 days  Quantity: 1.00;  Refills:  0   Ordered :27-Apr-2010  Alba Cory MD;  Started 05-Mar-2009 Active, Disp: , Rfl:  .  metoprolol succinate (TOPROL-XL) 25 MG 24 hr tablet, Take 1 tablet (25 mg total) by mouth daily., Disp: 90 tablet, Rfl: 0 .  ranitidine (ZANTAC) 150 MG capsule, Take by mouth., Disp: , Rfl:  .  Vitamin D, Ergocalciferol, (DRISDOL) 50000 units CAPS capsule, Take 1 capsule (50,000 Units total) by mouth every 7 (seven) days., Disp: 12 capsule, Rfl: 0  No Known Allergies   ROS  Constitutional: Negative for fever, positive for weight change.  Respiratory: Negative for cough and  shortness of breath.   Cardiovascular: Negative for chest pain or palpitations.  Gastrointestinal: Negative for abdominal pain, no bowel changes.  Musculoskeletal: Negative for gait problem or joint swelling.  Skin: Negative for rash.  Neurological: Positive  for dizziness ( occasionally ) or headache.  No other specific complaints in a complete review of systems (except as listed in HPI above).  Objective  Filed Vitals:   07/17/15 0819  BP: 124/72  Pulse: 73  Temp: 98.6 F (37 C)  TempSrc: Oral  Resp: 16  Height:  (1.626 m)  Weight: 202 lb 14.4 oz (92.035 kg)  SpO2: 99%    Body mass index is 34.81 kg/(m^2).  Physical Exam  Constitutional: Patient appears well-developed and well-nourished. Obese  No distress.  HEENT: head atraumatic, normocephalic, pupils equal and reactive to light,  neck supple, throat within normal limits Cardiovascular: Normal rate, regular rhythm and normal heart sounds.  No murmur heard. No BLE edema. Pulmonary/Chest: Effort normal and breath sounds normal. No respiratory distress. Abdominal: Soft.  There is no tenderness. Psychiatric: Patient has a normal mood and affect. behavior is normal. Judgment and thought content normal. Muscular Skeletal: pain during palpation of right lower back, negative straight leg raise, normal rom of back  Recent Results (from the past 2160 hour(s))  POCT urinalysis dipstick     Status: None   Collection Time: 07/17/15  8:45 AM  Result Value Ref Range   Color, UA yellow    Clarity, UA clear    Glucose, UA neg    Bilirubin, UA neg    Ketones, UA neg    Spec Grav, UA 1.015    Blood, UA neg    pH, UA 6.5    Protein, UA trace    Urobilinogen, UA negative    Nitrite, UA neg    Leukocytes, UA Negative Negative     PHQ2/9: Depression screen [facility] 2/9 07/17/2015 02/06/2015  Decreased Interest 0 0  Down, Depressed, Hopeless 0 0  PHQ - 2 Score 0 0     Fall Risk: Fall Risk  07/17/2015 02/06/2015  Falls in the  past year? No No      Functional Status Survey: Is the patient deaf or have difficulty hearing?: No Does the patient have difficulty seeing, even when wearing glasses/contacts?: No Does the patient have difficulty concentrating, remembering, or making decisions?: No Does the patient have difficulty walking or climbing stairs?: No Does the patient have difficulty dressing or bathing?: No Does the patient have difficulty doing errands alone such as visiting a doctor's office or shopping?: No    Assessment & Plan  1. Right low back pain without sciatica  Discussed pain likely muscular, advised home exercises, discussed PT versus chiropractor and she chose the later. She may also resume Flexeril qhs prn . - POCT urinalysis dipstick - Urine culture - Chiropractor referral Dietitian)  2. Right lower quadrant pain  Normal abdominal exam, pain is from her back, we will send urine for culture since that is her concern, she does not have any urinary symptoms today - POCT urinalysis dipstick - Urine culture  3. Benign hypertension  - metoprolol succinate (TOPROL-XL) 25 MG 24 hr tablet; Take 1 tablet (25 mg total) by mouth daily.  Dispense: 90 tablet; Refill: 0 - cloNIDine (CATAPRES) 0.1 MG tablet; Take 1 tablet (0.1 mg total) by mouth every evening.  Dispense: 90 tablet; Refill: 1  4. Palpitation  - metoprolol succinate (TOPROL-XL) 25 MG 24 hr tablet; Take 1 tablet (25 mg total) by mouth daily.  Dispense: 90 tablet; Refill: 0  5. Hypothyroidism, postablative  - levothyroxine (SYNTHROID, LEVOTHROID) 112 MCG tablet; Take 1 tablet (112 mcg total) by mouth daily before breakfast.  Dispense: 90 tablet; Refill: 0  6. Vitamin D deficiency  - Vitamin D, Ergocalciferol, (DRISDOL) 50000 units CAPS capsule; Take 1 capsule (50,000 Units total) by mouth every 7 (seven) days.  Dispense: 12 capsule; Refill: 0  7. Night sweats  - cloNIDine (CATAPRES) 0.1 MG tablet; Take 1 tablet  (0.1 mg total) by mouth every evening.  Dispense: 90 tablet; Refill: 1

## 2015-07-17 NOTE — Patient Instructions (Signed)

## 2015-07-19 LAB — URINE CULTURE

## 2015-07-20 ENCOUNTER — Telehealth: Payer: Self-pay

## 2015-07-20 ENCOUNTER — Telehealth: Payer: Self-pay | Admitting: Family Medicine

## 2015-07-20 NOTE — Telephone Encounter (Signed)
-----   Message from Alba CoryKrichna Sowles, MD sent at 07/19/2015  1:49 PM EDT ----- Negative for UTI, how is she doing?

## 2015-07-20 NOTE — Telephone Encounter (Signed)
Pt was returning your call. She states it is ok for you to leave a message on her VM.

## 2015-07-20 NOTE — Telephone Encounter (Signed)
Unable to leave message for patient but to call to ask how she was doing since her UTI was negative

## 2015-08-08 ENCOUNTER — Other Ambulatory Visit: Payer: Self-pay | Admitting: Family Medicine

## 2015-10-16 ENCOUNTER — Ambulatory Visit: Payer: BLUE CROSS/BLUE SHIELD | Admitting: Family Medicine

## 2015-10-18 ENCOUNTER — Other Ambulatory Visit: Payer: Self-pay | Admitting: Family Medicine

## 2015-10-19 NOTE — Telephone Encounter (Signed)
appt made for 12-08-15

## 2015-10-19 NOTE — Telephone Encounter (Signed)
Patient requesting refill. 

## 2015-12-05 ENCOUNTER — Other Ambulatory Visit: Payer: Self-pay | Admitting: Family Medicine

## 2015-12-08 ENCOUNTER — Encounter: Payer: Self-pay | Admitting: Family Medicine

## 2015-12-08 ENCOUNTER — Ambulatory Visit (INDEPENDENT_AMBULATORY_CARE_PROVIDER_SITE_OTHER): Payer: BLUE CROSS/BLUE SHIELD | Admitting: Family Medicine

## 2015-12-08 VITALS — BP 136/74 | HR 68 | Temp 98.3°F | Resp 16 | Ht 64.0 in | Wt 198.9 lb

## 2015-12-08 DIAGNOSIS — M2550 Pain in unspecified joint: Secondary | ICD-10-CM | POA: Diagnosis not present

## 2015-12-08 DIAGNOSIS — Z862 Personal history of diseases of the blood and blood-forming organs and certain disorders involving the immune mechanism: Secondary | ICD-10-CM | POA: Diagnosis not present

## 2015-12-08 DIAGNOSIS — R1310 Dysphagia, unspecified: Secondary | ICD-10-CM

## 2015-12-08 DIAGNOSIS — E785 Hyperlipidemia, unspecified: Secondary | ICD-10-CM | POA: Diagnosis not present

## 2015-12-08 DIAGNOSIS — E559 Vitamin D deficiency, unspecified: Secondary | ICD-10-CM | POA: Diagnosis not present

## 2015-12-08 DIAGNOSIS — Z131 Encounter for screening for diabetes mellitus: Secondary | ICD-10-CM

## 2015-12-08 DIAGNOSIS — K219 Gastro-esophageal reflux disease without esophagitis: Secondary | ICD-10-CM

## 2015-12-08 DIAGNOSIS — I10 Essential (primary) hypertension: Secondary | ICD-10-CM | POA: Diagnosis not present

## 2015-12-08 DIAGNOSIS — J302 Other seasonal allergic rhinitis: Secondary | ICD-10-CM

## 2015-12-08 DIAGNOSIS — E669 Obesity, unspecified: Secondary | ICD-10-CM

## 2015-12-08 DIAGNOSIS — E890 Postprocedural hypothyroidism: Secondary | ICD-10-CM

## 2015-12-08 LAB — COMPLETE METABOLIC PANEL WITH GFR
ALT: 15 U/L (ref 6–29)
AST: 19 U/L (ref 10–35)
Albumin: 4.3 g/dL (ref 3.6–5.1)
Alkaline Phosphatase: 93 U/L (ref 33–115)
BILIRUBIN TOTAL: 0.3 mg/dL (ref 0.2–1.2)
BUN: 10 mg/dL (ref 7–25)
CHLORIDE: 102 mmol/L (ref 98–110)
CO2: 29 mmol/L (ref 20–31)
CREATININE: 0.68 mg/dL (ref 0.50–1.10)
Calcium: 9.6 mg/dL (ref 8.6–10.2)
GFR, Est African American: >89 mL/min (ref >=60)
GFR, Est Non African American: >89 mL/min (ref >=60)
GLUCOSE: 101 mg/dL — AB (ref 65–99)
Potassium: 3.9 mmol/L (ref 3.5–5.3)
Sodium: 140 mmol/L (ref 135–146)
TOTAL PROTEIN: 7.4 g/dL (ref 6.1–8.1)

## 2015-12-08 LAB — TSH: TSH: 2.41 mIU/L

## 2015-12-08 LAB — CBC WITH DIFFERENTIAL/PLATELET
BASOS PCT: 1 %
Basophils Absolute: 63 cells/uL (ref 0–200)
EOS PCT: 3 %
Eosinophils Absolute: 189 cells/uL (ref 15–500)
HCT: 37.4 % (ref 35.0–45.0)
Hemoglobin: 12 g/dL (ref 11.7–15.5)
LYMPHS ABS: 2583 {cells}/uL (ref 850–3900)
LYMPHS PCT: 41 %
MCH: 24.9 pg — ABNORMAL LOW (ref 27.0–33.0)
MCHC: 32.1 g/dL (ref 32.0–36.0)
MCV: 77.6 fL — AB (ref 80.0–100.0)
MONO ABS: 441 {cells}/uL (ref 200–950)
MPV: 9.1 fL (ref 7.5–12.5)
Monocytes Relative: 7 %
NEUTROS PCT: 48 %
Neutro Abs: 3024 cells/uL (ref 1500–7800)
PLATELETS: 386 10*3/uL (ref 140–400)
RBC: 4.82 MIL/uL (ref 3.80–5.10)
RDW: 17.1 % — AB (ref 11.0–15.0)
WBC: 6.3 10*3/uL (ref 3.8–10.8)

## 2015-12-08 LAB — LIPID PANEL
CHOL/HDL RATIO: 1.9 ratio (ref <=5.0)
Cholesterol: 220 mg/dL — ABNORMAL HIGH (ref 125–200)
HDL: 115 mg/dL (ref >=46)
LDL CALC: 90 mg/dL (ref <130)
Triglycerides: 74 mg/dL (ref <150)
VLDL: 15 mg/dL (ref <30)

## 2015-12-08 LAB — C-REACTIVE PROTEIN: CRP: 2 mg/dL — ABNORMAL HIGH (ref <0.60)

## 2015-12-08 MED ORDER — FLUTICASONE PROPIONATE 50 MCG/ACT NA SUSP: 2.0000 | Freq: Every day | NASAL | 6 refills | Status: DC

## 2015-12-08 MED ORDER — METOPROLOL SUCCINATE ER 25 MG PO TB24: 25.0000 mg | ORAL_TABLET | Freq: Every day | ORAL | 1 refills | Status: DC

## 2015-12-08 NOTE — Progress Notes (Signed)
Name: Shelia Vasquez   MRN: [id number]    DOB: [birth year]   Date:12/08/2015       Progress Note  Subjective  Chief Complaint  Chief Complaint  Patient presents with  . Medication Refill    4 month F/U   . Joint Pain    Patient states it has been going on for months and is all over, achy feeling all over in knees, back and ankles. Patient takes Tylenol and Ibuprofen for relief.   . Back Pain    Flexeril has helped some but still having the pain occasionally   . Constipation    Patient has not had to take Linzess due to going to bathroom daily now  . Hypertension  . Hypothyroidism  . Gastroesophageal Reflux    Takes as needed, stable     HPI  Back pain: recurrent. She was seen in December 2016 and March 2017  with similar symptoms. Pain improved with Flexeril. She states pain is described as cramping like right low back pain that radiates to the right iliac crest. No dysuria, hematuria or urinary frequency. No problems walking. She works sitting all day but states that getting up for walks during her breaks has helped  Joint pains: both ankles, both wrists and left hip are stiff for about one hour every morning, improves with movement. No swelling or redness of joints. It is described as aching sensation that is intermittent, triggered by activity or palpation  HTN: currently on ly on Toprol XL 25 mg. Palpitations and chest pain have resolved, she still occasional dizziness/orthostatic changes, but has improved. She missed appointment with cardiologist - she forgot because symptoms resolved with medication adjustment. BP is a little high today, but usually at goal.   Hypothyroidism: taking medication daily, no change in bowel movements,always has dry skin, no fatigue, she has lost a few pounds since last visit by eating healthier.   GERD: she has symptoms intermittent, taking Zantac prn. Usually when she eats spicy food, it causes heart burn, and some dysphagia occasionally.   Discussed referral to GI for possible EGD and esophageal evaluation but she would like to hold off for now.   Patient Active Problem List   Diagnosis Date Noted  . Anxiety and depression 02/06/2015  . Benign hypertension 02/06/2015  . Dyslipidemia 02/06/2015  . Insomnia 02/06/2015  . Arthralgia 02/06/2015  . Gastro-esophageal reflux disease without esophagitis 02/06/2015  . History of radioactive iodine thyroid ablation 02/06/2015  . Hypothyroidism, postablative 02/06/2015  . History of iron deficiency anemia 02/06/2015  . Obesity (BMI 30.0-34.9) 02/06/2015  . H/O: hysterectomy 02/06/2015  . Vitamin D deficiency 02/06/2015  . Seasonal allergic rhinitis 02/06/2015  . Chronic constipation 02/06/2015  . History of uterine fibroid 05/29/2013    Past Surgical History:  Procedure Laterality Date  . ABDOMINAL HYSTERECTOMY    . BTL    . CESAREAN SECTION    . TUBAL LIGATION      Family History  Problem Relation Age of Onset  . Arthritis Father   . Autism Daughter     Social History   Social History  . Marital status: Married    Spouse name: N/A  . Number of children: N/A  . Years of education: N/A   Occupational History  . Not on file.   Social History Main Topics  . Smoking status: Never Smoker  . Smokeless tobacco: Never Used  . Alcohol use No  . Drug use: No  . Sexual activity: Yes  Partners: Male   Other Topics Concern  . Not on file   Social History Narrative  . No narrative on file     Current Outpatient Prescriptions:  .  aspirin 81 MG tablet, Take 1 tablet by mouth daily., Disp: , Rfl:  .  Cholecalciferol (VITAMIN D) 2000 units CAPS, Take 1 capsule by mouth daily., Disp: , Rfl:  .  cyclobenzaprine (FLEXERIL) 10 MG tablet, Take 1 tablet (10 mg total) by mouth 3 (three) times daily as needed for muscle spasms., Disp: 30 tablet, Rfl: 0 .  fluticasone (FLONASE) 50 MCG/ACT nasal spray, Place 2 sprays into both nostrils daily., Disp: 16 g, Rfl: 6 .   levothyroxine (SYNTHROID, LEVOTHROID) 112 MCG tablet, TAKE 1 TABLET BY MOUTH EVERY DAY (PLEASE SCHEDULE APPT WITH OFFICE), Disp: 90 tablet, Rfl: 0 .  metoprolol succinate (TOPROL-XL) 25 MG 24 hr tablet, Take 1 tablet (25 mg total) by mouth daily., Disp: 90 tablet, Rfl: 1 .  ranitidine (ZANTAC) 150 MG capsule, Take by mouth., Disp: , Rfl:   No Known Allergies   ROS  Constitutional: Negative for fever , positive for mild  weight change.  Respiratory: Negative for cough and shortness of breath.   Cardiovascular: Negative for chest pain or palpitations.  Gastrointestinal: Negative for abdominal pain, no bowel changes.  Musculoskeletal: Negative for gait problem or joint swelling.  Skin: Negative for rash.  Neurological: Negative for dizziness or headache.  No other specific complaints in a complete review of systems (except as listed in HPI above).  Objective  Vitals:   12/08/15 0845 12/08/15 0953  BP: (!) 142/78 136/74  Pulse: 68   Resp: 16   Temp: 98.3 F (36.8 C)   TempSrc: Oral   SpO2: 95%   Weight: 198 lb 14.4 oz (90.2 kg)   Height: 5\' 4"  (1.626 m)     Body mass index is 34.14 kg/m.  Physical Exam  Constitutional: Patient appears well-developed and well-nourished. Obese  No distress.  HEENT: head atraumatic, normocephalic, pupils equal and reactive to light, neck supple, throat within normal limits, possible thyromegaly Cardiovascular: Normal rate, regular rhythm and normal heart sounds.  No murmur heard. No BLE edema. Pulmonary/Chest: Effort normal and breath sounds normal. No respiratory distress. Abdominal: Soft.  There is no tenderness. Psychiatric: Patient has a normal mood and affect. behavior is normal. Judgment and thought content normal. Muscular Skeletal: no synovitis or redness of joints, pain with palpation and rom of both ankles and writs   PHQ2/9: Depression screen [facility] 2/9 12/08/2015 07/17/2015 02/06/2015  Decreased Interest 0 0 0  Down, Depressed,  Hopeless - 0 0  PHQ - 2 Score 0 0 0     Fall Risk: Fall Risk  12/08/2015 07/17/2015 02/06/2015  Falls in the past year? No No No     Functional Status Survey: Is the patient deaf or have difficulty hearing?: No Does the patient have difficulty seeing, even when wearing glasses/contacts?: No Does the patient have difficulty concentrating, remembering, or making decisions?: No Does the patient have difficulty walking or climbing stairs?: No Does the patient have difficulty dressing or bathing?: No Does the patient have difficulty doing errands alone such as visiting a doctor's office or shopping?: No    Assessment & Plan  1. Dyslipidemia  - Lipid panel  2. Gastro-esophageal reflux disease without esophagitis  Continue prn medication  3. Arthralgia  - Ambulatory referral to Rheumatology - Sedimentation rate - C-reactive protein  4. Benign hypertension  - COMPLETE METABOLIC PANEL  WITH GFR - metoprolol succinate (TOPROL-XL) 25 MG 24 hr tablet; Take 1 tablet (25 mg total) by mouth daily.  Dispense: 90 tablet; Refill: 1  5. History of iron deficiency anemia  - CBC with Differential/Platelet  6. Hypothyroidism, postablative  - TSH  7. Obesity (BMI 30.0-34.9)  Discussed with the patient the risk posed by an increased BMI. Discussed importance of portion control, calorie counting and at least 150 minutes of physical activity weekly. Avoid sweet beverages and drink more water. Eat at least 6 servings of fruit and vegetables daily   8. Vitamin D deficiency  - VITAMIN D 25 Hydroxy (Vit-D Deficiency, Fractures)  9. Seasonal allergic rhinitis  - fluticasone (FLONASE) 50 MCG/ACT nasal spray; Place 2 sprays into both nostrils daily.  Dispense: 16 g; Refill: 6  10. Screening for diabetes mellitus (DM)  - Hemoglobin A1c  11. Dysphagia  Discussed referral to GI, but she would like to hold off, states symptoms occurs on upper esophagus and happens with bread or  bananas  11. Dysphagia  - US Soft Tissue Head/Neck; Future

## 2015-12-09 ENCOUNTER — Other Ambulatory Visit: Payer: Self-pay | Admitting: Family Medicine

## 2015-12-09 ENCOUNTER — Encounter: Payer: Self-pay | Admitting: Family Medicine

## 2015-12-09 DIAGNOSIS — R739 Hyperglycemia, unspecified: Secondary | ICD-10-CM | POA: Insufficient documentation

## 2015-12-09 DIAGNOSIS — R7982 Elevated C-reactive protein (CRP): Secondary | ICD-10-CM | POA: Insufficient documentation

## 2015-12-09 LAB — HEMOGLOBIN A1C
Hgb A1c MFr Bld: 6.2 % — ABNORMAL HIGH (ref <5.7)
Mean Plasma Glucose: 131 mg/dL

## 2015-12-09 LAB — SEDIMENTATION RATE: SED RATE: 32 mm/h — AB (ref 0–20)

## 2015-12-09 LAB — VITAMIN D 25 HYDROXY (VIT D DEFICIENCY, FRACTURES): Vit D, 25-Hydroxy: 22 ng/mL — ABNORMAL LOW (ref 30–100)

## 2015-12-09 MED ORDER — VITAMIN D (ERGOCALCIFEROL) 1.25 MG (50000 UNIT) PO CAPS: 50000.0000 [IU] | ORAL_CAPSULE | ORAL | 0 refills | Status: DC

## 2015-12-16 ENCOUNTER — Ambulatory Visit: Payer: BLUE CROSS/BLUE SHIELD

## 2015-12-30 DIAGNOSIS — F419 Anxiety disorder, unspecified: Secondary | ICD-10-CM | POA: Diagnosis not present

## 2015-12-30 DIAGNOSIS — M1990 Unspecified osteoarthritis, unspecified site: Secondary | ICD-10-CM | POA: Diagnosis not present

## 2015-12-30 DIAGNOSIS — R700 Elevated erythrocyte sedimentation rate: Secondary | ICD-10-CM | POA: Diagnosis not present

## 2015-12-30 DIAGNOSIS — R7982 Elevated C-reactive protein (CRP): Secondary | ICD-10-CM | POA: Diagnosis not present

## 2016-03-08 ENCOUNTER — Other Ambulatory Visit: Payer: Self-pay | Admitting: Family Medicine

## 2016-03-10 ENCOUNTER — Ambulatory Visit: Payer: BLUE CROSS/BLUE SHIELD | Admitting: Family Medicine

## 2016-05-21 ENCOUNTER — Other Ambulatory Visit: Payer: Self-pay | Admitting: Family Medicine

## 2016-05-23 NOTE — Telephone Encounter (Signed)
LMOM for pt to schedule an appt.

## 2016-07-18 ENCOUNTER — Encounter: Payer: Self-pay | Admitting: Family Medicine

## 2016-07-18 ENCOUNTER — Ambulatory Visit (INDEPENDENT_AMBULATORY_CARE_PROVIDER_SITE_OTHER): Payer: BLUE CROSS/BLUE SHIELD | Admitting: Family Medicine

## 2016-07-18 VITALS — BP 138/84 | HR 70 | Temp 98.9°F | Resp 16 | Ht 64.0 in | Wt 202.1 lb

## 2016-07-18 DIAGNOSIS — E890 Postprocedural hypothyroidism: Secondary | ICD-10-CM | POA: Diagnosis not present

## 2016-07-18 DIAGNOSIS — Z1211 Encounter for screening for malignant neoplasm of colon: Secondary | ICD-10-CM | POA: Diagnosis not present

## 2016-07-18 DIAGNOSIS — E559 Vitamin D deficiency, unspecified: Secondary | ICD-10-CM | POA: Diagnosis not present

## 2016-07-18 DIAGNOSIS — E669 Obesity, unspecified: Secondary | ICD-10-CM | POA: Diagnosis not present

## 2016-07-18 DIAGNOSIS — K219 Gastro-esophageal reflux disease without esophagitis: Secondary | ICD-10-CM | POA: Diagnosis not present

## 2016-07-18 DIAGNOSIS — E785 Hyperlipidemia, unspecified: Secondary | ICD-10-CM | POA: Diagnosis not present

## 2016-07-18 DIAGNOSIS — I10 Essential (primary) hypertension: Secondary | ICD-10-CM

## 2016-07-18 DIAGNOSIS — M2550 Pain in unspecified joint: Secondary | ICD-10-CM

## 2016-07-18 DIAGNOSIS — Z1231 Encounter for screening mammogram for malignant neoplasm of breast: Secondary | ICD-10-CM

## 2016-07-18 DIAGNOSIS — M545 Low back pain, unspecified: Secondary | ICD-10-CM

## 2016-07-18 DIAGNOSIS — R700 Elevated erythrocyte sedimentation rate: Secondary | ICD-10-CM | POA: Insufficient documentation

## 2016-07-18 DIAGNOSIS — J302 Other seasonal allergic rhinitis: Secondary | ICD-10-CM

## 2016-07-18 DIAGNOSIS — Z1239 Encounter for other screening for malignant neoplasm of breast: Secondary | ICD-10-CM

## 2016-07-18 DIAGNOSIS — R7982 Elevated C-reactive protein (CRP): Secondary | ICD-10-CM

## 2016-07-18 MED ORDER — CYCLOBENZAPRINE HCL 10 MG PO TABS: 10.0000 mg | ORAL_TABLET | Freq: Three times a day (TID) | ORAL | 0 refills | Status: DC | PRN

## 2016-07-18 MED ORDER — FLUTICASONE PROPIONATE 50 MCG/ACT NA SUSP: 2.0000 | Freq: Every day | NASAL | 6 refills | Status: DC

## 2016-07-18 MED ORDER — METOPROLOL SUCCINATE ER 25 MG PO TB24: 25.0000 mg | ORAL_TABLET | Freq: Every day | ORAL | 1 refills | Status: DC

## 2016-07-18 MED ORDER — VITAMIN D (ERGOCALCIFEROL) 1.25 MG (50000 UNIT) PO CAPS: 50000.0000 [IU] | ORAL_CAPSULE | ORAL | 0 refills | Status: DC

## 2016-07-18 MED ORDER — LEVOTHYROXINE SODIUM 112 MCG PO TABS: ORAL_TABLET | ORAL | 1 refills | Status: DC

## 2016-07-18 NOTE — Progress Notes (Signed)
Name: Shelia Vasquez   MRN: [id number]    DOB: Jan 12, 1966   Date:07/18/2016       Progress Note  Subjective  Chief Complaint  Chief Complaint  Patient presents with  . Medication Refill  . Hypertension  . Hyperlipidemia  . Obesity  . Gastroesophageal Reflux    HPI  Back pain: recurrent. She was seen in December 2016 and March 2017 and 11/2015  with similar symptoms. Pain improved with Flexeril in the past. She states pain is described as cramping like right low back pain that radiates to the right iliac crest, and sometimes to right upper back. No dysuria, hematuria or urinary frequency. No problems walking. She works sitting all day but states that getting up for walks during her breaks has helped. No rashes around the area of pain. Denies paresthesias or weakness  Joint pains: both ankles, both wrists and left hip and back are stiff for about one hour every morning, improves with movement. No swelling or redness of joints. It is described as aching sensation that is intermittent, triggered by activity or palpation. Worse on ankles. Seen by Dr. Renard Matter because of elevated sed rate and c-reactive protein but initial evaluation negative except for persistent elevation of inflammatory markers. We will recheck labs, get colonoscopy and mammogram and advised her to go back as recommended by Dr. Renard Matter  HTN: currently  Toprol XL 25 mg. Palpitations and chest pain have resolved, she still occasional dizziness/orthostatic changes, but has improved, very seldom now.  BP is at goal. She missed appointment with cardiologist   Hypothyroidism: taking medication daily ( however missed appointment with [place] and has been out of medication for about 10 days), no change in bowel movements, always has dry skin, no fatigue, no significant weight change  GERD: she has symptoms intermittent, taking Zantac prn. Usually when she eats spicy food, it causes heart burn, and some dysphagia occasionally.  Discussed  referral to GI for possible EGD and since she will go for colonoscopy advised her to have both.    Patient Active Problem List   Diagnosis Date Noted  . Sedimentation rate elevation 07/18/2016  . Elevated C-reactive protein (CRP) 12/09/2015  . Hyperglycemia 12/09/2015  . Anxiety and depression 02/06/2015  . Benign hypertension 02/06/2015  . Dyslipidemia 02/06/2015  . Insomnia 02/06/2015  . Arthralgia 02/06/2015  . Gastro-esophageal reflux disease without esophagitis 02/06/2015  . History of radioactive iodine thyroid ablation 02/06/2015  . Hypothyroidism, postablative 02/06/2015  . History of iron deficiency anemia 02/06/2015  . Obesity (BMI 30.0-34.9) 02/06/2015  . H/O: hysterectomy 02/06/2015  . Vitamin D deficiency 02/06/2015  . Seasonal allergic rhinitis 02/06/2015  . Chronic constipation 02/06/2015  . History of uterine fibroid 05/29/2013    Past Surgical History:  Procedure Laterality Date  . ABDOMINAL HYSTERECTOMY    . BTL    . CESAREAN SECTION    . TUBAL LIGATION      Family History  Problem Relation Age of Onset  . Arthritis Father   . Autism Daughter     Social History   Social History  . Marital status: Married    Spouse name: N/A  . Number of children: N/A  . Years of education: N/A   Occupational History  . Not on file.   Social History Main Topics  . Smoking status: Never Smoker  . Smokeless tobacco: Never Used  . Alcohol use No  . Drug use: No  . Sexual activity: Yes    Partners: Male  Other Topics Concern  . Not on file   Social History Narrative  . No narrative on file     Current Outpatient Prescriptions:  .  aspirin 81 MG tablet, Take 1 tablet by mouth daily., Disp: , Rfl:  .  Cholecalciferol (VITAMIN D) 2000 units CAPS, Take 1 capsule by mouth daily., Disp: , Rfl:  .  cyclobenzaprine (FLEXERIL) 10 MG tablet, Take 1 tablet (10 mg total) by mouth 3 (three) times daily as needed for muscle spasms., Disp: 30 tablet, Rfl: 0 .   fluticasone (FLONASE) 50 MCG/ACT nasal spray, Place 2 sprays into both nostrils daily., Disp: 16 g, Rfl: 6 .  levothyroxine (SYNTHROID, LEVOTHROID) 112 MCG tablet, TAKE 1 TABLET BY MOUTH EVERY DAY, Disp: 90 tablet, Rfl: 1 .  metoprolol succinate (TOPROL-XL) 25 MG 24 hr tablet, Take 1 tablet (25 mg total) by mouth daily., Disp: 90 tablet, Rfl: 1 .  ranitidine (ZANTAC) 150 MG capsule, Take by mouth., Disp: , Rfl:  .  Vitamin D, Ergocalciferol, (DRISDOL) 50000 units CAPS capsule, Take 1 capsule (50,000 Units total) by mouth every 7 (seven) days., Disp: 12 capsule, Rfl: 0  No Known Allergies   ROS  Constitutional: Negative for fever or significant weight change.  Respiratory: Negative for cough and shortness of breath.   Cardiovascular: Negative for chest pain or palpitations.  Gastrointestinal: Negative for abdominal pain, no bowel changes.  Musculoskeletal: Negative for gait problem or joint swelling.  Skin: Negative for rash.  Neurological: Negative for dizziness or headache.  No other specific complaints in a complete review of systems (except as listed in HPI above).  Objective  Vitals:   07/18/16 1130  BP: 138/84  Pulse: 70  Resp: 16  Temp: 98.9 F (37.2 C)  SpO2: 97%  Weight: 202 lb 2 oz (91.7 kg)  Height: 5\' 4"  (1.626 m)    Body mass index is 34.69 kg/m.  Physical Exam  Constitutional: Patient appears well-developed and well-nourished. Obese  No distress.  HEENT: head atraumatic, normocephalic, pupils equal and reactive to light,  neck supple, throat within normal limits Cardiovascular: Normal rate, regular rhythm and normal heart sounds.  No murmur heard. No BLE edema. Pulmonary/Chest: Effort normal and breath sounds normal. No respiratory distress. Abdominal: Soft.  There is no tenderness. Psychiatric: Patient has a normal mood and affect. behavior is normal. Judgment and thought content normal. Muscular-skeletal: no synovitis, ankles sore to touch, negative  straight leg raise, pain during palpation of right lower back.    PHQ2/9: Depression screen [facility] 2/9 12/08/2015 07/17/2015 02/06/2015  Decreased Interest 0 0 0  Down, Depressed, Hopeless - 0 0  PHQ - 2 Score 0 0 0     Fall Risk: Fall Risk  12/08/2015 07/17/2015 02/06/2015  Falls in the past year? No No No      Assessment & Plan  1. Dyslipidemia  On life style modification only   2. Gastro-esophageal reflux disease without esophagitis  Under control  3. Benign hypertension  - metoprolol succinate (TOPROL-XL) 25 MG 24 hr tablet; Take 1 tablet (25 mg total) by mouth daily.  Dispense: 90 tablet; Refill: 1  4. Arthralgia, unspecified joint  Seen by Dr. Renard Matter  5. Hypothyroidism, postablative  - levothyroxine (SYNTHROID, LEVOTHROID) 112 MCG tablet; TAKE 1 TABLET BY MOUTH EVERY DAY  Dispense: 90 tablet; Refill: 1  6. Vitamin D deficiency  - VITAMIN D 25 Hydroxy (Vit-D Deficiency, Fractures) - Vitamin D, Ergocalciferol, (DRISDOL) 50000 units CAPS capsule; Take 1 capsule (50,000 Units total)  by mouth every 7 (seven) days.  Dispense: 12 capsule; Refill: 0  7. Obesity (BMI 30.0-34.9)  Discussed with the patient the risk posed by an increased BMI. Discussed importance of portion control, calorie counting and at least 150 minutes of physical activity weekly. Avoid sweet beverages and drink more water. Eat at least 6 servings of fruit and vegetables daily   8. Elevated C-reactive protein (CRP)  Seen by Dr. Renard MatterBock, advised to go back in 4 months, labs at her office also showed elevation, patient feels fine except for joint aches and pains, and wakes up feeling stiff, we will recheck labs advised to have colonoscopy and mammogram and follow up with Dr. Renard MatterBock - C-reactive protein - Sedimentation rate  9. Sedimentation rate elevation  - C-reactive protein - Sedimentation rate  10. Colon cancer screening  - Ambulatory referral to Gastroenterology  11. Breast cancer screening  -  MM Digital Screening; Future  12. Chronic seasonal allergic rhinitis due to other allergen  - fluticasone (FLONASE) 50 MCG/ACT nasal spray; Place 2 sprays into both nostrils daily.  Dispense: 16 g; Refill: 6  13. Intermittent low back pain  - cyclobenzaprine (FLEXERIL) 10 MG tablet; Take 1 tablet (10 mg total) by mouth 3 (three) times daily as needed for muscle spasms.  Dispense: 30 tablet; Refill: 0

## 2016-07-19 LAB — C-REACTIVE PROTEIN: CRP: 19.7 mg/L — ABNORMAL HIGH (ref <8.0)

## 2016-07-19 LAB — VITAMIN D 25 HYDROXY (VIT D DEFICIENCY, FRACTURES): Vit D, 25-Hydroxy: 14 ng/mL — ABNORMAL LOW (ref 30–100)

## 2016-07-19 LAB — SEDIMENTATION RATE: Sed Rate: 42 mm/hr — ABNORMAL HIGH (ref 0–20)

## 2016-07-20 ENCOUNTER — Telehealth: Payer: Self-pay

## 2016-07-20 NOTE — Telephone Encounter (Signed)
-----   Message from Alba CoryKrichna Sowles, MD sent at 07/19/2016  4:57 PM EDT ----- Labs are worse, I will send a copy to Dr. Renard MatterBock. She needs to have colonoscopy and mammogram as recommended and follow up with Rheumatologist Vitamin D is low, I will send prescription vitamin D to  pharmacy and once finished she/he needs to take otc vitamin D 2000 units daily

## 2016-07-20 NOTE — Telephone Encounter (Signed)
Left message for patient to call back for lab results, also faxed her labs to Dr. Ardith DarkBock's office.

## 2016-08-24 DIAGNOSIS — M1990 Unspecified osteoarthritis, unspecified site: Secondary | ICD-10-CM | POA: Diagnosis not present

## 2016-08-24 DIAGNOSIS — G8929 Other chronic pain: Secondary | ICD-10-CM | POA: Insufficient documentation

## 2016-08-24 DIAGNOSIS — M545 Low back pain: Secondary | ICD-10-CM | POA: Diagnosis not present

## 2016-08-24 DIAGNOSIS — R700 Elevated erythrocyte sedimentation rate: Secondary | ICD-10-CM | POA: Diagnosis not present

## 2016-08-24 DIAGNOSIS — R7982 Elevated C-reactive protein (CRP): Secondary | ICD-10-CM | POA: Diagnosis not present

## 2016-08-26 ENCOUNTER — Ambulatory Visit
Admission: RE | Admit: 2016-08-26 | Discharge: 2016-08-26 | Disposition: A | Discharge status: Home / Self Care | Payer: BLUE CROSS/BLUE SHIELD | Source: Ambulatory Visit | Attending: Family Medicine | Admitting: Family Medicine

## 2016-08-26 DIAGNOSIS — Z1231 Encounter for screening mammogram for malignant neoplasm of breast: Secondary | ICD-10-CM | POA: Insufficient documentation

## 2016-08-26 DIAGNOSIS — Z1239 Encounter for other screening for malignant neoplasm of breast: Secondary | ICD-10-CM

## 2016-10-18 ENCOUNTER — Ambulatory Visit: Payer: BLUE CROSS/BLUE SHIELD | Admitting: Family Medicine

## 2017-01-17 ENCOUNTER — Telehealth: Payer: Self-pay | Admitting: Gastroenterology

## 2017-01-17 NOTE — Telephone Encounter (Signed)
Patient is returning a call to schedule a colonoscopy. Please call her between 4 -5

## 2017-01-19 NOTE — Telephone Encounter (Signed)
Patient called back wanting to talk to you

## 2017-01-20 ENCOUNTER — Other Ambulatory Visit: Payer: Self-pay

## 2017-01-20 DIAGNOSIS — Z1211 Encounter for screening for malignant neoplasm of colon: Secondary | ICD-10-CM

## 2017-01-24 ENCOUNTER — Other Ambulatory Visit: Payer: Self-pay

## 2017-01-24 DIAGNOSIS — Z1211 Encounter for screening for malignant neoplasm of colon: Secondary | ICD-10-CM

## 2017-03-07 ENCOUNTER — Encounter: Admission: RE | Payer: Self-pay | Source: Ambulatory Visit

## 2017-03-07 ENCOUNTER — Ambulatory Visit: Payer: BLUE CROSS/BLUE SHIELD | Admitting: Anesthesiology

## 2017-03-07 ENCOUNTER — Encounter
Admission: RE | Disposition: A | Discharge status: Home / Self Care | Payer: Self-pay | Source: Ambulatory Visit | Attending: Gastroenterology

## 2017-03-07 ENCOUNTER — Ambulatory Visit
Admission: RE | Admit: 2017-03-07 | Payer: BLUE CROSS/BLUE SHIELD | Source: Ambulatory Visit | Admitting: Gastroenterology

## 2017-03-07 ENCOUNTER — Encounter: Payer: Self-pay | Admitting: *Deleted

## 2017-03-07 ENCOUNTER — Ambulatory Visit
Admission: RE | Admit: 2017-03-07 | Discharge: 2017-03-07 | Disposition: A | Discharge status: Home / Self Care | Payer: BLUE CROSS/BLUE SHIELD | Source: Ambulatory Visit | Attending: Gastroenterology | Admitting: Gastroenterology

## 2017-03-07 DIAGNOSIS — Z90710 Acquired absence of both cervix and uterus: Secondary | ICD-10-CM | POA: Diagnosis not present

## 2017-03-07 DIAGNOSIS — I10 Essential (primary) hypertension: Secondary | ICD-10-CM | POA: Diagnosis not present

## 2017-03-07 DIAGNOSIS — E669 Obesity, unspecified: Secondary | ICD-10-CM | POA: Insufficient documentation

## 2017-03-07 DIAGNOSIS — E559 Vitamin D deficiency, unspecified: Secondary | ICD-10-CM | POA: Diagnosis not present

## 2017-03-07 DIAGNOSIS — F419 Anxiety disorder, unspecified: Secondary | ICD-10-CM | POA: Insufficient documentation

## 2017-03-07 DIAGNOSIS — Z1211 Encounter for screening for malignant neoplasm of colon: Secondary | ICD-10-CM | POA: Diagnosis not present

## 2017-03-07 DIAGNOSIS — F329 Major depressive disorder, single episode, unspecified: Secondary | ICD-10-CM | POA: Diagnosis not present

## 2017-03-07 DIAGNOSIS — E079 Disorder of thyroid, unspecified: Secondary | ICD-10-CM | POA: Diagnosis not present

## 2017-03-07 DIAGNOSIS — Z7982 Long term (current) use of aspirin: Secondary | ICD-10-CM | POA: Insufficient documentation

## 2017-03-07 DIAGNOSIS — K219 Gastro-esophageal reflux disease without esophagitis: Secondary | ICD-10-CM | POA: Diagnosis not present

## 2017-03-07 DIAGNOSIS — E7800 Pure hypercholesterolemia, unspecified: Secondary | ICD-10-CM | POA: Insufficient documentation

## 2017-03-07 DIAGNOSIS — G4700 Insomnia, unspecified: Secondary | ICD-10-CM | POA: Insufficient documentation

## 2017-03-07 DIAGNOSIS — Z6833 Body mass index (BMI) 33.0-33.9, adult: Secondary | ICD-10-CM | POA: Insufficient documentation

## 2017-03-07 DIAGNOSIS — Z79899 Other long term (current) drug therapy: Secondary | ICD-10-CM | POA: Insufficient documentation

## 2017-03-07 HISTORY — PX: COLONOSCOPY WITH PROPOFOL: SHX5780

## 2017-03-07 SURGERY — COLONOSCOPY WITH PROPOFOL
Anesthesia: General

## 2017-03-07 MED ORDER — PROPOFOL 500 MG/50ML IV EMUL
INTRAVENOUS | Status: DC | PRN
  Administered 2017-03-07: 120 ug/kg/min via INTRAVENOUS

## 2017-03-07 MED ORDER — SODIUM CHLORIDE 0.9 % IV SOLN
INTRAVENOUS | Status: DC | PRN
  Administered 2017-03-07: 08:00:00 via INTRAVENOUS

## 2017-03-07 MED ORDER — PROPOFOL 10 MG/ML IV BOLUS
INTRAVENOUS | Status: DC | PRN
  Administered 2017-03-07: 30 mg via INTRAVENOUS
  Administered 2017-03-07: 70 mg via INTRAVENOUS
  Administered 2017-03-07: 20 mg via INTRAVENOUS

## 2017-03-07 MED ORDER — SODIUM CHLORIDE 0.9 % IV SOLN: INTRAVENOUS | Status: DC

## 2017-03-07 MED ORDER — LIDOCAINE HCL (PF) 2 % IJ SOLN
INTRAMUSCULAR | Status: DC | PRN
  Administered 2017-03-07: 50 mg via INTRADERMAL

## 2017-03-07 NOTE — Transfer of Care (Signed)
Immediate Anesthesia Transfer of Care Note  Patient: Shelia Vasquez  Procedure(s) Performed: COLONOSCOPY WITH PROPOFOL (N/A )  Patient Location: Endoscopy Unit  Anesthesia Type:General  Level of Consciousness: awake and alert   Airway & Oxygen Therapy: Patient Spontanous Breathing and Patient connected to nasal cannula oxygen  Post-op Assessment: Report given to RN and Post -op Vital signs reviewed and stable  Post vital signs: Reviewed and stable  Last Vitals:  Vitals:   03/07/17 0713 03/07/17 0828  BP: (!) 159/98 104/61  Pulse: 77 79  Resp: 20 11  Temp: 36.6 C (!) 36.3 C  SpO2: 100% 100%    Last Pain:  Vitals:   03/07/17 0828  TempSrc: Tympanic         Complications: No apparent anesthesia complications

## 2017-03-07 NOTE — Anesthesia Postprocedure Evaluation (Signed)
Anesthesia Post Note  Patient: Shelia Vasquez  Procedure(s) Performed: COLONOSCOPY WITH PROPOFOL (N/A )  Patient location during evaluation: Endoscopy Anesthesia Type: General Level of consciousness: awake and alert and oriented Pain management: pain level controlled Vital Signs Assessment: post-procedure vital signs reviewed and stable Respiratory status: spontaneous breathing, nonlabored ventilation and respiratory function stable Cardiovascular status: blood pressure returned to baseline and stable Postop Assessment: no signs of nausea or vomiting Anesthetic complications: no     Last Vitals:  Vitals:   03/07/17 0848 03/07/17 0858  BP: 135/79 138/78  Pulse: 63 68  Resp: (!) 23 20  Temp:    SpO2: 99% 99%    Last Pain:  Vitals:   03/07/17 0828  TempSrc: Tympanic                 [name]

## 2017-03-07 NOTE — Anesthesia Preprocedure Evaluation (Signed)
Anesthesia Evaluation  Patient identified by MRN, date of birth, ID band Patient awake    Reviewed: Allergy & Precautions, NPO status , Patient's Chart, lab work & pertinent test results  History of Anesthesia Complications Negative for: history of anesthetic complications  Airway Mallampati: III  TM Distance: >3 FB Neck ROM: Full    Dental  (+) Partial Upper   Pulmonary neg pulmonary ROS, neg sleep apnea, neg COPD,    breath sounds clear to auscultation- rhonchi (-) wheezing      Cardiovascular hypertension, Pt. on medications (-) CAD, (-) Past MI and (-) Cardiac Stents  Rhythm:Regular Rate:Normal - Systolic murmurs and - Diastolic murmurs    Neuro/Psych PSYCHIATRIC DISORDERS Anxiety Depression negative neurological ROS     GI/Hepatic Neg liver ROS, GERD  ,  Endo/Other  neg diabetesHypothyroidism   Renal/GU negative Renal ROS     Musculoskeletal negative musculoskeletal ROS (+)   Abdominal (+) + obese,   Peds  Hematology  (+) anemia ,   Anesthesia Other Findings Past Medical History: No date: Anemia No date: Anxiety No date: Depression No date: GERD (gastroesophageal reflux disease) No date: High cholesterol No date: Hypertension No date: Insomnia No date: Joint pain No date: Obesity No date: Personal history of radiation exposure No date: Thyroid disease No date: Vitamin D deficiency   Reproductive/Obstetrics                             Anesthesia Physical Anesthesia Plan  ASA: II  Anesthesia Plan: General   Post-op Pain Management:    Induction: Intravenous  PONV Risk Score and Plan: 2 and Propofol infusion  Airway Management Planned: Natural Airway  Additional Equipment:   Intra-op Plan:   Post-operative Plan:   Informed Consent: I have reviewed the patients History and Physical, chart, labs and discussed the procedure including the risks, benefits and  alternatives for the proposed anesthesia with the patient or authorized representative who has indicated his/her understanding and acceptance.   Dental advisory given  Plan Discussed with: CRNA and Anesthesiologist  Anesthesia Plan Comments:         Anesthesia Quick Evaluation

## 2017-03-07 NOTE — H&P (Signed)
Midge Miniumarren Krishav Mamone, MD [facility] [address] [place], [place] [address] Phone: [phone] Fax : [phone]  Primary Care Physician:  Alba CorySowles, Krichna, MD Primary Gastroenterologist:  Dr. Servando SnareWohl  Pre-Procedure History & Physical: HPI:  Shelia Vasquez is a 51 y.o. female is here for a screening colonoscopy.   Past Medical History:  Diagnosis Date  . Anemia   . Anxiety   . Depression   . GERD (gastroesophageal reflux disease)   . High cholesterol   . Hypertension   . Insomnia   . Joint pain   . Obesity   . Personal history of radiation exposure   . Thyroid disease   . Vitamin D deficiency     Past Surgical History:  Procedure Laterality Date  . ABDOMINAL HYSTERECTOMY    . BTL    . CESAREAN SECTION    . TUBAL LIGATION      Prior to Admission medications   Medication Sig Start Date End Date Taking? Authorizing Provider  metoprolol succinate (TOPROL-XL) 25 MG 24 hr tablet Take 1 tablet (25 mg total) by mouth daily. 07/18/16  Yes Alba CorySowles, Krichna, MD  aspirin 81 MG tablet Take 1 tablet by mouth daily. 06/13/07   [provider]  Cholecalciferol (VITAMIN D) 2000 units CAPS Take 1 capsule by mouth daily.    [provider]  cyclobenzaprine (FLEXERIL) 10 MG tablet Take 1 tablet (10 mg total) by mouth 3 (three) times daily as needed for muscle spasms. 07/18/16   Alba CorySowles, Krichna, MD  fluticasone (FLONASE) 50 MCG/ACT nasal spray Place 2 sprays into both nostrils daily. 07/18/16   Alba CorySowles, Krichna, MD  levothyroxine (SYNTHROID, LEVOTHROID) 112 MCG tablet TAKE 1 TABLET BY MOUTH EVERY DAY 07/18/16   Alba CorySowles, Krichna, MD  ranitidine (ZANTAC) 150 MG capsule Take by mouth.    [provider]  Vitamin D, Ergocalciferol, (DRISDOL) 50000 units CAPS capsule Take 1 capsule (50,000 Units total) by mouth every 7 (seven) days. 07/18/16   Alba CorySowles, Krichna, MD    Allergies as of 03/02/2017  . (No Known Allergies)    Family History  Problem Relation Age of Onset  .  Arthritis Father   . Autism Daughter   . Breast cancer Neg Hx     Social History   Socioeconomic History  . Marital status: Married    Spouse name: Not on file  . Number of children: Not on file  . Years of education: Not on file  . Highest education level: Not on file  Social Needs  . Financial resource strain: Not on file  . Food insecurity - worry: Not on file  . Food insecurity - inability: Not on file  . Transportation needs - medical: Not on file  . Transportation needs - non-medical: Not on file  Occupational History  . Not on file  Tobacco Use  . Smoking status: Never Smoker  . Smokeless tobacco: Never Used  Substance and Sexual Activity  . Alcohol use: No    Alcohol/week: 0.0 oz  . Drug use: No  . Sexual activity: Yes    Partners: Male  Other Topics Concern  . Not on file  Social History Narrative  . Not on file    Review of Systems: See HPI, otherwise negative ROS  Physical Exam: BP (!) 159/98   Pulse 77   Temp 97.8 F (36.6 C) (Tympanic)   Resp 20   Ht 5\' 4"  (1.626 m)   Wt 198 lb (89.8 kg)   SpO2 100%  BMI 33.99 kg/m  General:   Alert,  pleasant and cooperative in NAD Head:  Normocephalic and atraumatic. Neck:  Supple; no masses or thyromegaly. Lungs:  Clear throughout to auscultation.    Heart:  Regular rate and rhythm. Abdomen:  Soft, nontender and nondistended. Normal bowel sounds, without guarding, and without rebound.   Neurologic:  Alert and  oriented x4;  grossly normal neurologically.  Impression/Plan: Shelia Vasquez is now here to undergo a screening colonoscopy.  Risks, benefits, and alternatives regarding colonoscopy have been reviewed with the patient.  Questions have been answered.  All parties agreeable.

## 2017-03-07 NOTE — Anesthesia Post-op Follow-up Note (Signed)
Anesthesia QCDR form completed.        

## 2017-03-07 NOTE — Op Note (Signed)
[facility] Gastroenter[facility] Patient Name: Shelia FoleySheila Vasquez Procedure Date: 03/07/2017 7:58 AM MRN: [id number] Account #: [id number] Date of Birth: 1965-07-25 Admit Type: Outpatient Age: 51 [facility] ROOM 4 Gender: Female Note Status: Finalized Procedure:            Colonoscopy Indications:          Screening for colorectal malignant neoplasm Providers:            Midge Miniumarren Lillia Lengel MD, MD Referring MD:         Onnie BoerKrichna F. Sowles, MD (Referring MD) Medicines:            Propofol per Anesthesia Complications:        No immediate complications. Procedure:            Pre-Anesthesia Assessment:                       - Prior to the procedure, a History and Physical was                        performed, and patient medications and allergies were                        reviewed. The patient's tolerance of previous                        anesthesia was also reviewed. The risks and benefits of                        the procedure and the sedation options and risks were                        discussed with the patient. All questions were                        answered, and informed consent was obtained. Prior                        Anticoagulants: The patient has taken no previous                        anticoagulant or antiplatelet agents. ASA Grade                        Assessment: II - A patient with mild systemic disease.                        After reviewing the risks and benefits, the patient was                        deemed in satisfactory condition to undergo the                        procedure.                       After obtaining informed consent, the colonoscope was                        passed under direct vision. Throughout the procedure,  the patient's blood pressure, pulse, and oxygen                        saturations were monitored continuously. The                        Colonoscope was introduced through the anus and                advanced to the the cecum, identified by appendiceal                        orifice and ileocecal valve. The colonoscopy was                        performed without difficulty. The patient tolerated the                        procedure well. The quality of the bowel preparation                        was excellent. Findings:      The perianal and digital rectal examinations were normal.      The colon (entire examined portion) appeared normal. Impression:           - The entire examined colon is normal.                       - No specimens collected. Recommendation:       - Discharge patient to home.                       - Resume previous diet.                       - Continue present medications.                       - Await pathology results.                       - Repeat colonoscopy in 10 years for screening unless                        any change in family history or lower GI problems. Procedure Code(s):    --- Professional ---                       [phone], Colonoscopy, flexible; diagnostic, including                        collection of specimen(s) by brushing or washing, when                        performed (separate procedure) Diagnosis Code(s):    --- Professional ---                       Z12.11, Encounter for screening for malignant neoplasm                        of colon CPT copyright 2016 American Medical Association. All rights reserved. The codes documented in this report are preliminary and upon  coder review may  be revised to meet current compliance requirements. Midge Minium MD, MD 03/07/2017 8:25:45 AM This report has been signed electronically. Number of Addenda: 0 Note Initiated On: 03/07/2017 7:58 AM Scope Withdrawal Time: 0 hours 8 minutes 21 seconds  Total Procedure Duration: 0 hours 11 minutes 53 seconds       [facility]

## 2017-03-08 ENCOUNTER — Encounter: Payer: Self-pay | Admitting: Gastroenterology

## 2017-03-09 ENCOUNTER — Other Ambulatory Visit: Payer: Self-pay | Admitting: Family Medicine

## 2017-03-09 DIAGNOSIS — I10 Essential (primary) hypertension: Secondary | ICD-10-CM

## 2017-03-12 ENCOUNTER — Other Ambulatory Visit: Payer: Self-pay | Admitting: Family Medicine

## 2017-03-12 DIAGNOSIS — E890 Postprocedural hypothyroidism: Secondary | ICD-10-CM

## 2017-03-12 NOTE — Telephone Encounter (Signed)
Not seen since March, is she still my patient? Needs follow up, we can give her emergency supply until her next visit

## 2017-03-13 MED ORDER — LEVOTHYROXINE SODIUM 112 MCG PO TABS: ORAL_TABLET | ORAL | 0 refills | Status: DC

## 2017-03-13 NOTE — Telephone Encounter (Signed)
Refill request for thyroid medication: Levothyroxine 112 mcg  Last Physical: None indicated  Follow up visit: 03/29/2017   Lab Results  Component Value Date   TSH 2.41 12/08/2015

## 2017-03-13 NOTE — Telephone Encounter (Signed)
Pt has an appt for FU on 03/29/17

## 2017-03-13 NOTE — Telephone Encounter (Signed)
Ok. I will let Dr. Carlynn PurlSowles know, so she can see emergency supply until her appt.

## 2017-03-29 ENCOUNTER — Ambulatory Visit (INDEPENDENT_AMBULATORY_CARE_PROVIDER_SITE_OTHER): Payer: BLUE CROSS/BLUE SHIELD | Admitting: Family Medicine

## 2017-03-29 ENCOUNTER — Encounter: Payer: Self-pay | Admitting: Family Medicine

## 2017-03-29 VITALS — BP 150/100 | HR 61 | Temp 99.3°F | Resp 14 | Ht 64.0 in | Wt 200.3 lb

## 2017-03-29 DIAGNOSIS — E785 Hyperlipidemia, unspecified: Secondary | ICD-10-CM

## 2017-03-29 DIAGNOSIS — R739 Hyperglycemia, unspecified: Secondary | ICD-10-CM | POA: Diagnosis not present

## 2017-03-29 DIAGNOSIS — M545 Low back pain: Secondary | ICD-10-CM

## 2017-03-29 DIAGNOSIS — K219 Gastro-esophageal reflux disease without esophagitis: Secondary | ICD-10-CM

## 2017-03-29 DIAGNOSIS — R350 Frequency of micturition: Secondary | ICD-10-CM | POA: Diagnosis not present

## 2017-03-29 DIAGNOSIS — R7982 Elevated C-reactive protein (CRP): Secondary | ICD-10-CM

## 2017-03-29 DIAGNOSIS — M7062 Trochanteric bursitis, left hip: Secondary | ICD-10-CM | POA: Diagnosis not present

## 2017-03-29 DIAGNOSIS — E559 Vitamin D deficiency, unspecified: Secondary | ICD-10-CM | POA: Diagnosis not present

## 2017-03-29 DIAGNOSIS — E890 Postprocedural hypothyroidism: Secondary | ICD-10-CM

## 2017-03-29 DIAGNOSIS — M2550 Pain in unspecified joint: Secondary | ICD-10-CM | POA: Diagnosis not present

## 2017-03-29 DIAGNOSIS — R700 Elevated erythrocyte sedimentation rate: Secondary | ICD-10-CM

## 2017-03-29 DIAGNOSIS — K5909 Other constipation: Secondary | ICD-10-CM

## 2017-03-29 DIAGNOSIS — I10 Essential (primary) hypertension: Secondary | ICD-10-CM | POA: Diagnosis not present

## 2017-03-29 DIAGNOSIS — G8929 Other chronic pain: Secondary | ICD-10-CM

## 2017-03-29 DIAGNOSIS — D649 Anemia, unspecified: Secondary | ICD-10-CM | POA: Diagnosis not present

## 2017-03-29 LAB — POCT URINALYSIS DIPSTICK
BILIRUBIN UA: NEGATIVE
Glucose, UA: NEGATIVE
Ketones, UA: NEGATIVE
Leukocytes, UA: NEGATIVE
NITRITE UA: NEGATIVE
PH UA: 6.5 (ref 5.0–8.0)
PROTEIN UA: NEGATIVE
Spec Grav, UA: 1.02 (ref 1.010–1.025)
UROBILINOGEN UA: NEGATIVE U/dL — AB

## 2017-03-29 MED ORDER — METOPROLOL SUCCINATE ER 25 MG PO TB24: 25.0000 mg | ORAL_TABLET | Freq: Every day | ORAL | 1 refills | Status: DC

## 2017-03-29 MED ORDER — MELOXICAM 15 MG PO TABS: 15.0000 mg | ORAL_TABLET | Freq: Every day | ORAL | 0 refills | Status: DC

## 2017-03-29 NOTE — Progress Notes (Signed)
Name: Shelia Vasquez   MRN: [id number]    DOB: Oct 14, 1965   Date:03/29/2017       Progress Note  Subjective  Chief Complaint  Chief Complaint  Patient presents with  . Medication Refill  . Nocturia  . Hip Pain    HPI   Outer left hip pain: she states severe symptoms around Thanksgiving, causing discomfort even to put shoes on, pain gradually improved by itself, however continues to have difficulty sleeping on left lateral decubitus, pain with palpation the area, it radiates to lateral thigh but not below that, no radiation to groin, no weakness, but pain has caused her to limp at times. She states she has nocturia, but is chronic and unchanged  Constipation: she states she has been out of Linzess and she has to strain but able to go daily, with medication she feels better, no straining.  Back pain: recurrent. She was seen in December 2016 and March 2017 and 11/2015 with similar symptoms. Pain improved with Flexeril in the past. She states pain is described as cramping like right low back pain that radiates to the right iliac crest, and sometimes to right upper back. No dysuria, hematuria or urinary frequency. No problems walking. She works sitting all day but states that getting up for walks during her breaks has helped. No rashes around the area of pain. Denies paresthesias or weakness  Joint pains: both ankles, both wrists and left hip and back are stiff for about one hour every morning, improves with movement. No swelling or redness of joints. It is described as aching sensation that is intermittent, triggered by activity or palpation.Seen by Dr. Renard Matter because of elevated sed rate and c-reactive protein but initial evaluation negative except for persistent elevation of inflammatory markers. Dr. Renard Matter has treated her with prednisone taper and symptoms improved, but returned once stopped medication, she lost to follow up with her because she was in her busy season at work, she will  re-schedule an appointment.   HTN: currently  Toprol XL 25 mg.Palpitations and chest pain no longer present , she still occasional dizziness/orthostatic changes, however seldom.   BP is elevated today, advised to check bp a few times a week and we may need to adjust medication, she used to take Norvasc and lisinopril and may need to resume it. . She missed appointment with cardiologist    Hypothyroidism: taking medication daily 112 mcg , s/p ablation,  no change in bowel movements, always has dry skin, no fatigue, no significant weight change  GERD: she has symptoms intermittent, taking Zantac prn. Usually when she eats spicy food, it causes heart burn, and some dysphagia occasionally. Colonoscopy done 02/2017 and normal    Patient Active Problem List   Diagnosis Date Noted  . Chronic midline low back pain without sciatica 08/24/2016  . Sedimentation rate elevation 07/18/2016  . Elevated C-reactive protein (CRP) 12/09/2015  . Hyperglycemia 12/09/2015  . Anxiety and depression 02/06/2015  . Benign hypertension 02/06/2015  . Dyslipidemia 02/06/2015  . Insomnia 02/06/2015  . Arthralgia 02/06/2015  . Gastro-esophageal reflux disease without esophagitis 02/06/2015  . History of radioactive iodine thyroid ablation 02/06/2015  . Hypothyroidism, postablative 02/06/2015  . History of iron deficiency anemia 02/06/2015  . Obesity (BMI 30.0-34.9) 02/06/2015  . H/O: hysterectomy 02/06/2015  . Vitamin D deficiency 02/06/2015  . Seasonal allergic rhinitis 02/06/2015  . Chronic constipation 02/06/2015  . History of uterine fibroid 05/29/2013    Past Surgical History:  Procedure Laterality Date  .  ABDOMINAL HYSTERECTOMY    . BTL    . CESAREAN SECTION    . COLONOSCOPY WITH PROPOFOL N/A 03/07/2017   Procedure: COLONOSCOPY WITH PROPOFOL;  Surgeon: Midge MiniumWohl, Darren, MD;  [facility];  Service: Endoscopy;  Laterality: N/A;  . TUBAL LIGATION      Family History  Problem Relation Age  of Onset  . Arthritis Father   . Autism Daughter   . Breast cancer Neg Hx     Social History   Socioeconomic History  . Marital status: Married    Spouse name: Not on file  . Number of children: Not on file  . Years of education: Not on file  . Highest education level: Not on file  Social Needs  . Financial resource strain: Not on file  . Food insecurity - worry: Not on file  . Food insecurity - inability: Not on file  . Transportation needs - medical: Not on file  . Transportation needs - non-medical: Not on file  Occupational History  . Not on file  Tobacco Use  . Smoking status: Never Smoker  . Smokeless tobacco: Never Used  Substance and Sexual Activity  . Alcohol use: No    Alcohol/week: 0.0 oz  . Drug use: No  . Sexual activity: Yes    Partners: Male  Other Topics Concern  . Not on file  Social History Narrative  . Not on file     Current Outpatient Medications:  .  aspirin 81 MG tablet, Take 1 tablet by mouth daily., Disp: , Rfl:  .  Cholecalciferol (VITAMIN D) 2000 units CAPS, Take 1 capsule by mouth daily., Disp: , Rfl:  .  cyclobenzaprine (FLEXERIL) 10 MG tablet, Take 1 tablet (10 mg total) by mouth 3 (three) times daily as needed for muscle spasms., Disp: 30 tablet, Rfl: 0 .  fluticasone (FLONASE) 50 MCG/ACT nasal spray, Place 2 sprays into both nostrils daily., Disp: 16 g, Rfl: 6 .  levothyroxine (SYNTHROID, LEVOTHROID) 112 MCG tablet, TAKE 1 TABLET BY MOUTH EVERY DAY, Disp: 20 tablet, Rfl: 0 .  metoprolol succinate (TOPROL-XL) 25 MG 24 hr tablet, Take 1 tablet (25 mg total) by mouth daily., Disp: 90 tablet, Rfl: 1 .  ranitidine (ZANTAC) 150 MG capsule, Take by mouth., Disp: , Rfl:  .  linaclotide (LINZESS) 145 MCG CAPS capsule, Take 1 tablet by mouth daily., Disp: , Rfl:  .  meloxicam (MOBIC) 15 MG tablet, Take 1 tablet (15 mg total) by mouth daily., Disp: 30 tablet, Rfl: 0  No Known Allergies   ROS  Constitutional: Negative for fever or weight  change.  Respiratory: Negative for cough and shortness of breath.   Cardiovascular: Negative for chest pain or palpitations.  Gastrointestinal: Negative for abdominal pain, no bowel changes.  Musculoskeletal: Positive  for gait problem but no  joint swelling.  Skin: Negative for rash.  Neurological: Negative for dizziness or headache.  No other specific complaints in a complete review of systems (except as listed in HPI above).  Objective  Vitals:   03/29/17 0937  BP: (!) 150/90  Pulse: 60  Resp: 14  Temp: 99.3 F (37.4 C)  TempSrc: Oral  SpO2: 99%  Weight: 200 lb 4.8 oz (90.9 kg)  Height: 5\' 4"  (1.626 m)    Body mass index is 34.38 kg/m.  Physical Exam  Constitutional: Patient appears well-developed and well-nourished. Obese No distress.  HEENT: head atraumatic, normocephalic, pupils equal and reactive to light,  neck supple, throat within normal limits Cardiovascular:  Normal rate, regular rhythm and normal heart sounds.  No murmur heard. No BLE edema. Pulmonary/Chest: Effort normal and breath sounds normal. No respiratory distress. Abdominal: Soft.  There is no tenderness. Negative CVA tenderness Muscular Skeletal: pain during palpation of left outer hip, normal rom of hip, negative straight leg raise Psychiatric: Patient has a normal mood and affect. behavior is normal. Judgment and thought content normal.  Recent Results (from the past 2160 hour(s))  POCT Urinalysis Dipstick     Status: Abnormal   Collection Time: 03/29/17  9:45 AM  Result Value Ref Range   Color, UA yellow    Clarity, UA clear    Glucose, UA negative    Bilirubin, UA negative    Ketones, UA negative    Spec Grav, UA 1.020 1.010 - 1.025   Blood, UA trace    pH, UA 6.5 5.0 - 8.0   Protein, UA negative    Urobilinogen, UA negative (A) 0.2 or 1.0 E.U./dL   Nitrite, UA negative    Leukocytes, UA Negative Negative      PHQ2/9: [facility] 12/08/2015 07/17/2015 02/06/2015   Decreased Interest 0 0 0  Down, Depressed, Hopeless - 0 0  PHQ - 2 Score 0 0 0     Fall Risk: Fall Risk  03/29/2017 12/08/2015 07/17/2015 02/06/2015  Falls in the past year? No No No No     Functional Status Survey: Is the patient deaf or have difficulty hearing?: No Does the patient have difficulty seeing, even when wearing glasses/contacts?: No Does the patient have difficulty concentrating, remembering, or making decisions?: No Does the patient have difficulty walking or climbing stairs?: No Does the patient have difficulty dressing or bathing?: No Does the patient have difficulty doing errands alone such as visiting a doctor's office or shopping?: No    Assessment & Plan  1. Urinary frequency  Not the cause of her pain, chronic nocturia but we will check urine culture - POCT Urinalysis Dipstick - Urine Culture  2. Vitamin D deficiency  - VITAMIN D 25 Hydroxy (Vit-D Deficiency, Fractures)  3. Sedimentation rate elevation  Needs to follow up with Dr. Renard MatterBock, last visit was in May and was supposed to go back in one month  4. Hyperglycemia  - Hemoglobin A1c  5. Elevated C-reactive protein (CRP)  - C-reactive protein - Sedimentation rate  6. Gastro-esophageal reflux disease without esophagitis  Continue Ranitidine prn   7. Dyslipidemia  - Lipid panel  8. Chronic midline low back pain without sciatica  stable  9. Chronic constipation  Resume Linzess  10. Arthralgia, unspecified joint  - C-reactive protein - Sedimentation rate  11. Uncontrolled hypertension  - CBC with Differential/Platelet - COMPLETE METABOLIC PANEL WITH GFR  12. Trochanteric bursitis, left hip  - meloxicam (MOBIC) 15 MG tablet; Take 1 tablet (15 mg total) by mouth daily.  Dispense: 30 tablet; Refill: 0 Discussed PT or chiropractor, needs to use foam roll, stretch   13. Postablative hypothyroidism  - TSH  14. Benign hypertension  Last rx writen 06/2016 not sure how  compliant, bp is not controlled today, advised to follow up in 2 weeks - metoprolol succinate (TOPROL-XL) 25 MG 24 hr tablet; Take 1 tablet (25 mg total) by mouth daily.  Dispense: 90 tablet; Refill: 1

## 2017-03-30 LAB — URINE CULTURE
MICRO NUMBER: 81367836
SPECIMEN QUALITY:: ADEQUATE

## 2017-04-04 ENCOUNTER — Other Ambulatory Visit: Payer: Self-pay | Admitting: Family Medicine

## 2017-04-04 DIAGNOSIS — E559 Vitamin D deficiency, unspecified: Secondary | ICD-10-CM

## 2017-04-04 DIAGNOSIS — D649 Anemia, unspecified: Secondary | ICD-10-CM

## 2017-04-04 LAB — CBC WITH DIFFERENTIAL/PLATELET
BASOS ABS: 40 {cells}/uL (ref 0–200)
Basophils Relative: 0.6 %
EOS PCT: 2.2 %
Eosinophils Absolute: 147 cells/uL (ref 15–500)
HCT: 34.2 % — ABNORMAL LOW (ref 35.0–45.0)
Hemoglobin: 11.3 g/dL — ABNORMAL LOW (ref 11.7–15.5)
Lymphs Abs: 2372 cells/uL (ref 850–3900)
MCH: 25.3 pg — ABNORMAL LOW (ref 27.0–33.0)
MCHC: 33 g/dL (ref 32.0–36.0)
MCV: 76.5 fL — ABNORMAL LOW (ref 80.0–100.0)
MONOS PCT: 6.7 %
MPV: 9.4 fL (ref 7.5–12.5)
NEUTROS PCT: 55.1 %
Neutro Abs: 3692 cells/uL (ref 1500–7800)
PLATELETS: 415 10*3/uL — AB (ref 140–400)
RBC: 4.47 10*6/uL (ref 3.80–5.10)
RDW: 14.5 % (ref 11.0–15.0)
TOTAL LYMPHOCYTE: 35.4 %
WBC mixed population: 449 cells/uL (ref 200–950)
WBC: 6.7 10*3/uL (ref 3.8–10.8)

## 2017-04-04 LAB — COMPLETE METABOLIC PANEL WITH GFR
AG Ratio: 1.2 (calc) (ref 1.0–2.5)
ALKALINE PHOSPHATASE (APISO): 82 U/L (ref 33–130)
ALT: 14 U/L (ref 6–29)
AST: 18 U/L (ref 10–35)
Albumin: 3.9 g/dL (ref 3.6–5.1)
BUN: 18 mg/dL (ref 7–25)
CALCIUM: 9.4 mg/dL (ref 8.6–10.4)
CO2: 29 mmol/L (ref 20–32)
CREATININE: 0.81 mg/dL (ref 0.50–1.05)
Chloride: 103 mmol/L (ref 98–110)
GFR, EST NON AFRICAN AMERICAN: 85 mL/min/{1.73_m2} (ref >=60)
GFR, Est African American: 98 mL/min/{1.73_m2} (ref >=60)
GLOBULIN: 3.2 g/dL (ref 1.9–3.7)
GLUCOSE: 85 mg/dL (ref 65–99)
Potassium: 3.9 mmol/L (ref 3.5–5.3)
SODIUM: 140 mmol/L (ref 135–146)
Total Bilirubin: 0.4 mg/dL (ref 0.2–1.2)
Total Protein: 7.1 g/dL (ref 6.1–8.1)

## 2017-04-04 LAB — TEST AUTHORIZATION

## 2017-04-04 LAB — LIPID PANEL
CHOL/HDL RATIO: 2.1 (calc) (ref <5.0)
CHOLESTEROL: 231 mg/dL — AB (ref <200)
HDL: 110 mg/dL (ref >50)
LDL Cholesterol (Calc): 105 mg/dL (calc) — ABNORMAL HIGH
NON-HDL CHOLESTEROL (CALC): 121 mg/dL (ref <130)
TRIGLYCERIDES: 70 mg/dL (ref <150)

## 2017-04-04 LAB — IRON,TIBC AND FERRITIN PANEL
%SAT: 14 % (ref 11–50)
FERRITIN: 35 ng/mL (ref 10–232)
IRON: 52 ug/dL (ref 45–160)
TIBC: 366 mcg/dL (calc) (ref 250–450)

## 2017-04-04 LAB — HEMOGLOBIN A1C
Hgb A1c MFr Bld: 6.6 % of total Hgb — ABNORMAL HIGH (ref <5.7)
Mean Plasma Glucose: 143 (calc)
eAG (mmol/L): 7.9 (calc)

## 2017-04-04 LAB — TSH: TSH: 0.71 mIU/L

## 2017-04-04 LAB — C-REACTIVE PROTEIN: CRP: 24.5 mg/L — ABNORMAL HIGH (ref <8.0)

## 2017-04-04 LAB — VITAMIN D 25 HYDROXY (VIT D DEFICIENCY, FRACTURES): Vit D, 25-Hydroxy: 16 ng/mL — ABNORMAL LOW (ref 30–100)

## 2017-04-04 LAB — SEDIMENTATION RATE: SED RATE: 29 mm/h — AB (ref 0–20)

## 2017-04-04 MED ORDER — VITAMIN D (ERGOCALCIFEROL) 1.25 MG (50000 UNIT) PO CAPS: 50000.0000 [IU] | ORAL_CAPSULE | ORAL | 0 refills | Status: DC

## 2017-04-13 ENCOUNTER — Encounter: Payer: Self-pay | Admitting: Family Medicine

## 2017-04-13 ENCOUNTER — Ambulatory Visit (INDEPENDENT_AMBULATORY_CARE_PROVIDER_SITE_OTHER): Payer: BLUE CROSS/BLUE SHIELD | Admitting: Family Medicine

## 2017-04-13 VITALS — BP 152/86 | HR 77 | Temp 98.1°F | Resp 18 | Ht 64.0 in | Wt 200.0 lb

## 2017-04-13 DIAGNOSIS — Z23 Encounter for immunization: Secondary | ICD-10-CM

## 2017-04-13 DIAGNOSIS — M2550 Pain in unspecified joint: Secondary | ICD-10-CM

## 2017-04-13 DIAGNOSIS — I10 Essential (primary) hypertension: Secondary | ICD-10-CM

## 2017-04-13 DIAGNOSIS — E119 Type 2 diabetes mellitus without complications: Secondary | ICD-10-CM | POA: Diagnosis not present

## 2017-04-13 DIAGNOSIS — R7982 Elevated C-reactive protein (CRP): Secondary | ICD-10-CM | POA: Diagnosis not present

## 2017-04-13 LAB — POCT UA - MICROALBUMIN
Albumin/Creatinine Ratio, Urine, POC: NEGATIVE
CREATININE, POC: NEGATIVE mg/dL
Microalbumin Ur, POC: NEGATIVE mg/L

## 2017-04-13 MED ORDER — OLMESARTAN MEDOXOMIL 40 MG PO TABS: 40.0000 mg | ORAL_TABLET | Freq: Every day | ORAL | 0 refills | Status: DC

## 2017-04-13 NOTE — Progress Notes (Signed)
Name: Shelia Vasquez   MRN: [id number]    DOB: 07/25/1965   Date:04/13/2017       Progress Note  Subjective  Chief Complaint  Chief Complaint  Patient presents with  . Hypertension  . Follow-up  . Labs Only    Discuss Results    HPI  DMII: new onset, she has noticed polydipsia, and polyuria, not polyphagia, last hgbA1C a few weeks ago was 6.6%. She is also obese and has family history of DM, discussed medications but she would like to try life style modification for now.   HTN: not on norvasc because of edema, does not like diuretics because of urinary frequency, off clonidine, currently only on metoprolol. States very stressed at work and also missed her father , died 12-03-17and is still grieving. She also has family discord with siblings - he was on life support and her mother had to choose to withdraw life support, therefore her mother and sister had disagreement afterwards.   Chronic joint pains: seen by Rheumatologist, C-reactive protein persistently high, responds to prednisone, but explained needs to discuss other options since she now has DM   Patient Active Problem List   Diagnosis Date Noted  . Chronic midline low back pain without sciatica 08/24/2016  . Sedimentation rate elevation 07/18/2016  . Elevated C-reactive protein (CRP) 12/09/2015  . Hyperglycemia 12/09/2015  . Anxiety and depression 02/06/2015  . Benign hypertension 02/06/2015  . Dyslipidemia 02/06/2015  . Insomnia 02/06/2015  . Arthralgia 02/06/2015  . Gastro-esophageal reflux disease without esophagitis 02/06/2015  . History of radioactive iodine thyroid ablation 02/06/2015  . Hypothyroidism, postablative 02/06/2015  . History of iron deficiency anemia 02/06/2015  . Obesity (BMI 30.0-34.9) 02/06/2015  . H/O: hysterectomy 02/06/2015  . Vitamin D deficiency 02/06/2015  . Seasonal allergic rhinitis 02/06/2015  . Chronic constipation 02/06/2015  . History of uterine fibroid 05/29/2013    Past  Surgical History:  Procedure Laterality Date  . ABDOMINAL HYSTERECTOMY    . BTL    . CESAREAN SECTION    . COLONOSCOPY WITH PROPOFOL N/A 03/07/2017   Procedure: COLONOSCOPY WITH PROPOFOL;  Surgeon: Midge Minium, MD;  Location: [facility] ENDOSCOPY;  Service: Endoscopy;  Laterality: N/A;  . TUBAL LIGATION      Family History  Problem Relation Age of Onset  . Arthritis Father   . Autism Daughter   . Breast cancer Neg Hx     Social History   Socioeconomic History  . Marital status: Married    Spouse name: Not on file  . Number of children: Not on file  . Years of education: Not on file  . Highest education level: Not on file  Social Needs  . Financial resource strain: Not on file  . Food insecurity - worry: Not on file  . Food insecurity - inability: Not on file  . Transportation needs - medical: Not on file  . Transportation needs - non-medical: Not on file  Occupational History  . Not on file  Tobacco Use  . Smoking status: Never Smoker  . Smokeless tobacco: Never Used  Substance and Sexual Activity  . Alcohol use: No    Alcohol/week: 0.0 oz  . Drug use: No  . Sexual activity: Yes    Partners: Male  Other Topics Concern  . Not on file  Social History Narrative  . Not on file     Current Outpatient Medications:  .  aspirin 81 MG tablet, Take 1 tablet by mouth daily., Disp: ,  Rfl:  .  Cholecalciferol (VITAMIN D) 2000 units CAPS, Take 1 capsule by mouth daily., Disp: , Rfl:  .  cyclobenzaprine (FLEXERIL) 10 MG tablet, Take 1 tablet (10 mg total) by mouth 3 (three) times daily as needed for muscle spasms., Disp: 30 tablet, Rfl: 0 .  fluticasone (FLONASE) 50 MCG/ACT nasal spray, Place 2 sprays into both nostrils daily., Disp: 16 g, Rfl: 6 .  levothyroxine (SYNTHROID, LEVOTHROID) 112 MCG tablet, TAKE 1 TABLET BY MOUTH EVERY DAY, Disp: 20 tablet, Rfl: 0 .  linaclotide (LINZESS) 145 MCG CAPS capsule, Take 1 tablet by mouth daily., Disp: , Rfl:  .  meloxicam (MOBIC) 15 MG  tablet, Take 1 tablet (15 mg total) by mouth daily., Disp: 30 tablet, Rfl: 0 .  metoprolol succinate (TOPROL-XL) 25 MG 24 hr tablet, Take 1 tablet (25 mg total) by mouth daily., Disp: 90 tablet, Rfl: 1 .  ranitidine (ZANTAC) 150 MG capsule, Take by mouth., Disp: , Rfl:  .  Vitamin D, Ergocalciferol, (DRISDOL) 50000 units CAPS capsule, Take 1 capsule (50,000 Units total) by mouth every 7 (seven) days., Disp: 12 capsule, Rfl: 0 .  olmesartan (BENICAR) 40 MG tablet, Take 1 tablet (40 mg total) by mouth daily., Disp: 30 tablet, Rfl: 0  No Known Allergies   ROS  Constitutional: Negative for fever or weight change.  Respiratory: Negative for cough and shortness of breath.   Cardiovascular: Negative for chest pain or palpitations.  Gastrointestinal: Negative for abdominal pain, no bowel changes.  Musculoskeletal: Positive  for intermittent gait problem positive for joint swelling.  Skin: Negative for rash.  Neurological: Negative for dizziness or headache.  No other specific complaints in a complete review of systems (except as listed in HPI above).  Objective  Vitals:   04/13/17 1018 04/13/17 1020  BP: (!) 160/100 (!) 152/86  Pulse: 77   Resp: 18   Temp: 98.1 F (36.7 C)   TempSrc: Oral   SpO2: 98%   Weight: 200 lb (90.7 kg)   Height: 5\' 4"  (1.626 m)     Body mass index is 34.33 kg/m.  Physical Exam  Constitutional: Patient appears well-developed and well-nourished. Obese  No distress.  HEENT: head atraumatic, normocephalic, pupils equal and reactive to light,  neck supple, throat within normal limits Cardiovascular: Normal rate, regular rhythm and normal heart sounds.  No murmur heard. No BLE edema. Pulmonary/Chest: Effort normal and breath sounds normal. No respiratory distress. Abdominal: Soft.  There is no tenderness. Psychiatric: Patient has a normal mood and affect. behavior is normal. Judgment and thought content normal.  Recent Results (from the past 2160 hour(s))   POCT Urinalysis Dipstick     Status: Abnormal   Collection Time: 03/29/17  9:45 AM  Result Value Ref Range   Color, UA yellow    Clarity, UA clear    Glucose, UA negative    Bilirubin, UA negative    Ketones, UA negative    Spec Grav, UA 1.020 1.010 - 1.025   Blood, UA trace    pH, UA 6.5 5.0 - 8.0   Protein, UA negative    Urobilinogen, UA negative (A) 0.2 or 1.0 E.U./dL   Nitrite, UA negative    Leukocytes, UA Negative Negative  TSH     Status: None   Collection Time: 03/29/17 10:42 AM  Result Value Ref Range   TSH 0.71 mIU/L    Comment:           Reference Range .           >  or = 20 Years  0.40-4.50 .                Pregnancy Ranges           First trimester    0.26-2.66           Second trimester   0.55-2.73           Third trimester    0.43-2.91   VITAMIN D 25 Hydroxy (Vit-D Deficiency, Fractures)     Status: Abnormal   Collection Time: 03/29/17 10:42 AM  Result Value Ref Range   Vit D, 25-Hydroxy 16 (L) 30 - 100 ng/mL    Comment: Vitamin D Status         25-OH Vitamin D: . Deficiency:                    <20 ng/mL Insufficiency:             20 - 29 ng/mL Optimal:                 > or = 30 ng/mL . For 25-OH Vitamin D testing on patients on  D2-supplementation and patients for whom quantitation  of D2 and D3 fractions is required, the QuestAssureD(TM) 25-OH VIT D, (D2,D3), LC/MS/MS is recommended: order  code [id number] (patients >3257yrs). . For more information on this test, go to: http://education.[facility].com/faq/FAQ163 (This link is being provided for  informational/educational purposes only.)   CBC with Differential/Platelet     Status: Abnormal   Collection Time: 03/29/17 10:42 AM  Result Value Ref Range   WBC 6.7 3.8 - 10.8 Thousand/uL   RBC 4.47 3.80 - 5.10 Million/uL   Hemoglobin 11.3 (L) 11.7 - 15.5 g/dL   HCT 60.434.2 (L) 54.035.0 - 98.145.0 %   MCV 76.5 (L) 80.0 - 100.0 fL   MCH 25.3 (L) 27.0 - 33.0 pg   MCHC 33.0 32.0 - 36.0 g/dL   RDW 19.114.5 47.811.0 -  29.515.0 %   Platelets 415 (H) 140 - 400 Thousand/uL   MPV 9.4 7.5 - 12.5 fL   Neutro Abs 3,692 1,500 - 7,800 cells/uL   Lymphs Abs 2,372 850 - 3,900 cells/uL   WBC mixed population 449 200 - 950 cells/uL   Eosinophils Absolute 147 15 - 500 cells/uL   Basophils Absolute 40 0 - 200 cells/uL   Neutrophils Relative % 55.1 %   Total Lymphocyte 35.4 %   Monocytes Relative 6.7 %   Eosinophils Relative 2.2 %   Basophils Relative 0.6 %  COMPLETE METABOLIC PANEL WITH GFR     Status: None   Collection Time: 03/29/17 10:42 AM  Result Value Ref Range   Glucose, Bld 85 65 - 99 mg/dL    Comment: .            Fasting reference interval .    BUN 18 7 - 25 mg/dL   Creat 6.210.81 3.080.50 - 6.571.05 mg/dL    Comment: For patients >50 years of age, the reference limit for Creatinine is approximately 13% higher for people identified as African-American. .    GFR, Est Non African American 85 > OR = 60 mL/min/1.3473m2   GFR, Est African American 98 > OR = 60 mL/min/1.6473m2   BUN/Creatinine Ratio NOT APPLICABLE 6 - 22 (calc)   Sodium 140 135 - 146 mmol/L   Potassium 3.9 3.5 - 5.3 mmol/L   Chloride 103 98 - 110 mmol/L   CO2 29 20 - 32 mmol/L   Calcium  9.4 8.6 - 10.4 mg/dL   Total Protein 7.1 6.1 - 8.1 g/dL   Albumin 3.9 3.6 - 5.1 g/dL   Globulin 3.2 1.9 - 3.7 g/dL (calc)   AG Ratio 1.2 1.0 - 2.5 (calc)   Total Bilirubin 0.4 0.2 - 1.2 mg/dL   Alkaline phosphatase (APISO) 82 33 - 130 U/L   AST 18 10 - 35 U/L   ALT 14 6 - 29 U/L  Hemoglobin A1c     Status: Abnormal   Collection Time: 03/29/17 10:42 AM  Result Value Ref Range   Hgb A1c MFr Bld 6.6 (H) <5.7 % of total Hgb    Comment: For someone without known diabetes, a hemoglobin A1c value of 6.5% or greater indicates that they may have  diabetes and this should be confirmed with a follow-up  test. . For someone with known diabetes, a value <7% indicates  that their diabetes is well controlled and a value  greater than or equal to 7% indicates suboptimal   control. A1c targets should be individualized based on  duration of diabetes, age, comorbid conditions, and  other considerations. . Currently, no consensus exists regarding use of hemoglobin A1c for diagnosis of diabetes for children. .    Mean Plasma Glucose 143 (calc)   eAG (mmol/L) 7.9 (calc)  Lipid panel     Status: Abnormal   Collection Time: 03/29/17 10:42 AM  Result Value Ref Range   Cholesterol 231 (H) <200 mg/dL   HDL 696 >29 mg/dL   Triglycerides 70 <528 mg/dL   LDL Cholesterol (Calc) 105 (H) mg/dL (calc)    Comment: Reference range: <100 . Desirable range <100 mg/dL for primary prevention;   <70 mg/dL for patients with CHD or diabetic patients  with > or = 2 CHD risk factors. Marland Kitchen LDL-C is now calculated using the Martin-Hopkins  calculation, which is a validated novel method providing  better accuracy than the Friedewald equation in the  estimation of LDL-C.  Horald Pollen et al. Lenox Ahr. 4132;440(10): 2061-2068  (http://education.[facility].com/faq/FAQ164)    Total CHOL/HDL Ratio 2.1 <5.0 (calc)   Non-HDL Cholesterol (Calc) 121 <130 mg/dL (calc)    Comment: For patients with diabetes plus 1 major ASCVD risk  factor, treating to a non-HDL-C goal of <100 mg/dL  (LDL-C of <27 mg/dL) is considered a therapeutic  option.   C-reactive protein     Status: Abnormal   Collection Time: 03/29/17 10:42 AM  Result Value Ref Range   CRP 24.5 (H) <8.0 mg/L  Sedimentation rate     Status: Abnormal   Collection Time: 03/29/17 10:42 AM  Result Value Ref Range   Sed Rate 29 (H) 0 - 20 mm/h  Iron, TIBC and Ferritin Panel     Status: None   Collection Time: 03/29/17 10:42 AM  Result Value Ref Range   Iron 52 45 - 160 mcg/dL   TIBC 253 664 - 403 mcg/dL (calc)   %SAT 14 11 - 50 % (calc)   Ferritin 35 10 - 232 ng/mL  TEST AUTHORIZATION     Status: None   Collection Time: 03/29/17 10:42 AM  Result Value Ref Range   TEST NAME: IRON, TIBC AND FERRITIN PANEL    TEST CODE:  [id number]    CLIENT CONTACT: ERA MATKINS    REPORT ALWAYS MESSAGE SIGNATURE      Comment: . The laboratory testing on this patient was verbally requested or confirmed by the ordering physician or his or her authorized representative after contact with  an employee of [employer]. Federal regulations require that we maintain on file written authorization for all laboratory testing.  Accordingly we are asking that the ordering physician or his or her authorized representative sign a copy of this report and promptly return it to the client service representative. . . Signature:____________________________________________________ . Please fax this signed page to [phone] or return it via your [employer] courier.   Urine Culture     Status: None   Collection Time: 03/29/17 11:22 AM  Result Value Ref Range   MICRO NUMBER: [id number]    SPECIMEN QUALITY: ADEQUATE    Sample Source URINE    STATUS: FINAL    Result:      Three or more organisms present, each greater than 10,000 cu/mL. May represent normal flora contamination from external genitalia. No further testing is required.    Diabetic Foot Exam: Diabetic Foot Exam - Simple   Simple Foot Form Diabetic Foot exam was performed with the following findings:  Yes 04/13/2017 11:09 AM  Visual Inspection No deformities, no ulcerations, no other skin breakdown bilaterally:  Yes Sensation Testing Intact to touch and monofilament testing bilaterally:  Yes Pulse Check Posterior Tibialis and Dorsalis pulse intact bilaterally:  Yes Comments      PHQ2/9: Depression screen [facility] 2/9 12/08/2015 07/17/2015 02/06/2015  Decreased Interest 0 0 0  Down, Depressed, Hopeless - 0 0  PHQ - 2 Score 0 0 0    Fall Risk: Fall Risk  04/13/2017 03/29/2017 12/08/2015 07/17/2015 02/06/2015  Falls in the past year? No No No No No    Functional Status Survey: Is the patient deaf or have difficulty hearing?: No Does the patient have  difficulty seeing, even when wearing glasses/contacts?: No Does the patient have difficulty concentrating, remembering, or making decisions?: No Does the patient have difficulty walking or climbing stairs?: No Does the patient have difficulty dressing or bathing?: No Does the patient have difficulty doing errands alone such as visiting a doctor's office or shopping?: No    Assessment & Plan  1. Uncontrolled hypertension  - olmesartan (BENICAR) 40 MG tablet; Take 1 tablet (40 mg total) by mouth daily.  Dispense: 30 tablet; Refill: 0  2. Elevated C-reactive protein (CRP)  Continue follow up with Rheumatologist   3. Arthralgia, unspecified joint  See above  4. New onset type 2 diabetes mellitus (HCC)  - POCT UA - Microalbumin - Amb ref to Medical Nutrition Therapy-MNT - olmesartan (BENICAR) 40 MG tablet; Take 1 tablet (40 mg total) by mouth daily.  Dispense: 30 tablet; Refill: 0  5. Need for vaccination for pneumococcus  - Pneumococcal polysaccharide vaccine 23-valent greater than or equal to 2yo subcutaneous/IM

## 2017-04-13 NOTE — Patient Instructions (Signed)
Diabetes Mellitus and Nutrition When you have diabetes (diabetes mellitus), it is very important to have healthy eating habits because your blood sugar (glucose) levels are greatly affected by what you eat and drink. Eating healthy foods in the appropriate amounts, at about the same times every day, can help you:  Control your blood glucose.  Lower your risk of heart disease.  Improve your blood pressure.  Reach or maintain a healthy weight.  Every person with diabetes is different, and each person has different needs for a meal plan. Your health care provider may recommend that you work with a diet and nutrition specialist (dietitian) to make a meal plan that is best for you. Your meal plan may vary depending on factors such as:  The calories you need.  The medicines you take.  Your weight.  Your blood glucose, blood pressure, and cholesterol levels.  Your activity level.  Other health conditions you have, such as heart or kidney disease.  How do carbohydrates affect me? Carbohydrates affect your blood glucose level more than any other type of food. Eating carbohydrates naturally increases the amount of glucose in your blood. Carbohydrate counting is a method for keeping track of how many carbohydrates you eat. Counting carbohydrates is important to keep your blood glucose at a healthy level, especially if you use insulin or take certain oral diabetes medicines. It is important to know how many carbohydrates you can safely have in each meal. This is different for every person. Your dietitian can help you calculate how many carbohydrates you should have at each meal and for snack. Foods that contain carbohydrates include:  Bread, cereal, rice, pasta, and crackers.  Potatoes and corn.  Peas, beans, and lentils.  Milk and yogurt.  Fruit and juice.  Desserts, such as cakes, cookies, ice cream, and candy.  How does alcohol affect me? Alcohol can cause a sudden decrease in blood  glucose (hypoglycemia), especially if you use insulin or take certain oral diabetes medicines. Hypoglycemia can be a life-threatening condition. Symptoms of hypoglycemia (sleepiness, dizziness, and confusion) are similar to symptoms of having too much alcohol. If your health care provider says that alcohol is safe for you, follow these guidelines:  Limit alcohol intake to no more than 1 drink per day for nonpregnant women and 2 drinks per day for men. One drink equals 12 oz of beer, 5 oz of wine, or 1 oz of hard liquor.  Do not drink on an empty stomach.  Keep yourself hydrated with water, diet soda, or unsweetened iced tea.  Keep in mind that regular soda, juice, and other mixers may contain a lot of sugar and must be counted as carbohydrates.  What are tips for following this plan? Reading food labels  Start by checking the serving size on the label. The amount of calories, carbohydrates, fats, and other nutrients listed on the label are based on one serving of the food. Many foods contain more than one serving per package.  Check the total grams (g) of carbohydrates in one serving. You can calculate the number of servings of carbohydrates in one serving by dividing the total carbohydrates by 15. For example, if a food has 30 g of total carbohydrates, it would be equal to 2 servings of carbohydrates.  Check the number of grams (g) of saturated and trans fats in one serving. Choose foods that have low or no amount of these fats.  Check the number of milligrams (mg) of sodium in one serving. Most people   should limit total sodium intake to less than 2,300 mg per day.  Always check the nutrition information of foods labeled as "low-fat" or "nonfat". These foods may be higher in added sugar or refined carbohydrates and should be avoided.  Talk to your dietitian to identify your daily goals for nutrients listed on the label. Shopping  Avoid buying canned, premade, or processed foods. These  foods tend to be high in fat, sodium, and added sugar.  Shop around the outside edge of the grocery store. This includes fresh fruits and vegetables, bulk grains, fresh meats, and fresh dairy. Cooking  Use low-heat cooking methods, such as baking, instead of high-heat cooking methods like deep frying.  Cook using healthy oils, such as olive, canola, or sunflower oil.  Avoid cooking with butter, cream, or high-fat meats. Meal planning  Eat meals and snacks regularly, preferably at the same times every day. Avoid going long periods of time without eating.  Eat foods high in fiber, such as fresh fruits, vegetables, beans, and whole grains. Talk to your dietitian about how many servings of carbohydrates you can eat at each meal.  Eat 4-6 ounces of lean protein each day, such as lean meat, chicken, fish, eggs, or tofu. 1 ounce is equal to 1 ounce of meat, chicken, or fish, 1 egg, or 1/4 cup of tofu.  Eat some foods each day that contain healthy fats, such as avocado, nuts, seeds, and fish. Lifestyle   Check your blood glucose regularly.  Exercise at least 30 minutes 5 or more days each week, or as told by your health care provider.  Take medicines as told by your health care provider.  Do not use any products that contain nicotine or tobacco, such as cigarettes and e-cigarettes. If you need help quitting, ask your health care provider.  Work with a counselor or diabetes educator to identify strategies to manage stress and any emotional and social challenges. What are some questions to ask my health care provider?  Do I need to meet with a diabetes educator?  Do I need to meet with a dietitian?  What number can I call if I have questions?  When are the best times to check my blood glucose? Where to find more information:  American Diabetes Association: diabetes.org/food-and-fitness/food  Academy of Nutrition and Dietetics:  www.eatright.org/resources/health/diseases-and-conditions/diabetes  National Institute of Diabetes and Digestive and Kidney Diseases (NIH): www.niddk.nih.gov/health-information/diabetes/overview/diet-eating-physical-activity Summary  A healthy meal plan will help you control your blood glucose and maintain a healthy lifestyle.  Working with a diet and nutrition specialist (dietitian) can help you make a meal plan that is best for you.  Keep in mind that carbohydrates and alcohol have immediate effects on your blood glucose levels. It is important to count carbohydrates and to use alcohol carefully. This information is not intended to replace advice given to you by your health care provider. Make sure you discuss any questions you have with your health care provider. Document Released: 01/06/2005 Document Revised: 05/16/2016 Document Reviewed: 05/16/2016 Elsevier Interactive Patient Education  2018 Elsevier Inc.  

## 2017-05-01 ENCOUNTER — Other Ambulatory Visit: Payer: Self-pay

## 2017-05-01 DIAGNOSIS — M7062 Trochanteric bursitis, left hip: Secondary | ICD-10-CM

## 2017-05-01 NOTE — Telephone Encounter (Signed)
Refill request for general medication: Meloxicam to [facility]   Last office visit: 04/13/2017   Follow up 07/12/2017

## 2017-05-02 MED ORDER — MELOXICAM 15 MG PO TABS: 15.0000 mg | ORAL_TABLET | Freq: Every day | ORAL | 0 refills | Status: DC

## 2017-05-09 ENCOUNTER — Other Ambulatory Visit: Payer: Self-pay

## 2017-05-09 ENCOUNTER — Other Ambulatory Visit: Payer: Self-pay | Admitting: Family Medicine

## 2017-05-09 DIAGNOSIS — E890 Postprocedural hypothyroidism: Secondary | ICD-10-CM

## 2017-05-09 MED ORDER — LEVOTHYROXINE SODIUM 112 MCG PO TABS: ORAL_TABLET | ORAL | 0 refills | Status: DC

## 2017-05-09 NOTE — Telephone Encounter (Signed)
Refill request for thyroid medication: Levothyroxine to [facility] with a 90 day supply.  Last Visit 04/13/2017   Lab Results  Component Value Date   TSH 0.71 03/29/2017     Follow up on 05/16/2017

## 2017-05-16 ENCOUNTER — Ambulatory Visit: Payer: BLUE CROSS/BLUE SHIELD | Admitting: Family Medicine

## 2017-05-22 ENCOUNTER — Other Ambulatory Visit: Payer: Self-pay

## 2017-05-22 DIAGNOSIS — I10 Essential (primary) hypertension: Secondary | ICD-10-CM

## 2017-05-22 DIAGNOSIS — E119 Type 2 diabetes mellitus without complications: Secondary | ICD-10-CM

## 2017-05-22 MED ORDER — OLMESARTAN MEDOXOMIL 40 MG PO TABS: 40.0000 mg | ORAL_TABLET | Freq: Every day | ORAL | 0 refills | Status: DC

## 2017-05-22 NOTE — Telephone Encounter (Signed)
Hypertension medication request: Olmesartan to [facility].  Last office visit pertaining to hypertension: 04/13/2017   BP Readings from Last 3 Encounters:  04/13/17 (!) 152/86  03/29/17 (!) 150/100  03/07/17 138/78    Lab Results  Component Value Date   CREATININE 0.81 03/29/2017   BUN 18 03/29/2017   NA 140 03/29/2017   K 3.9 03/29/2017   CL 103 03/29/2017   CO2 29 03/29/2017     Follow up on 07/12/2017

## 2017-05-30 ENCOUNTER — Ambulatory Visit: Payer: BLUE CROSS/BLUE SHIELD | Admitting: *Deleted

## 2017-06-18 ENCOUNTER — Other Ambulatory Visit: Payer: Self-pay | Admitting: Family Medicine

## 2017-06-18 DIAGNOSIS — E890 Postprocedural hypothyroidism: Secondary | ICD-10-CM

## 2017-06-19 ENCOUNTER — Other Ambulatory Visit: Payer: Self-pay | Admitting: Family Medicine

## 2017-06-19 ENCOUNTER — Other Ambulatory Visit: Payer: Self-pay

## 2017-06-19 DIAGNOSIS — E119 Type 2 diabetes mellitus without complications: Secondary | ICD-10-CM

## 2017-06-19 DIAGNOSIS — E890 Postprocedural hypothyroidism: Secondary | ICD-10-CM

## 2017-06-19 DIAGNOSIS — I10 Essential (primary) hypertension: Secondary | ICD-10-CM

## 2017-06-19 MED ORDER — LEVOTHYROXINE SODIUM 112 MCG PO TABS: 112.0000 ug | ORAL_TABLET | Freq: Every day | ORAL | 1 refills | Status: DC

## 2017-06-19 NOTE — Telephone Encounter (Signed)
Refill request for thyroid medication: Levothyroxine 112 mcg  Last Physical: 03/29/2017   Lab Results  Component Value Date   TSH 0.71 03/29/2017    Follow-up on file. 07/12/2017

## 2017-06-19 NOTE — Telephone Encounter (Signed)
Prescription request is for 90-days. Pharmacy says it has to be for 90- days.

## 2017-07-03 ENCOUNTER — Other Ambulatory Visit: Payer: Self-pay

## 2017-07-03 DIAGNOSIS — J3089 Other allergic rhinitis: Secondary | ICD-10-CM

## 2017-07-03 MED ORDER — FLUTICASONE PROPIONATE 50 MCG/ACT NA SUSP: 2.0000 | Freq: Every day | NASAL | 6 refills | Status: DC

## 2017-07-03 NOTE — Telephone Encounter (Signed)
Refill request for general medication: Fluticasone to [facility] with a 90 day supply.   Last office visit: 04/13/2017   Follow up on 07/12/2017

## 2017-07-11 ENCOUNTER — Other Ambulatory Visit: Payer: Self-pay

## 2017-07-11 DIAGNOSIS — I10 Essential (primary) hypertension: Secondary | ICD-10-CM

## 2017-07-11 DIAGNOSIS — E119 Type 2 diabetes mellitus without complications: Secondary | ICD-10-CM

## 2017-07-11 NOTE — Telephone Encounter (Signed)
Hypertension medication request:Olmesartan to [facility] with a 90 day supply   Last office visit pertaining to hypertension: 04/13/2017  BP Readings from Last 3 Encounters:  04/13/17 (!) 152/86  03/29/17 (!) 150/100  03/07/17 138/78    Lab Results  Component Value Date   CREATININE 0.81 03/29/2017   BUN 18 03/29/2017   NA 140 03/29/2017   K 3.9 03/29/2017   CL 103 03/29/2017   CO2 29 03/29/2017    Follow up on 07/12/2017

## 2017-07-12 ENCOUNTER — Encounter: Payer: Self-pay | Admitting: Family Medicine

## 2017-07-12 ENCOUNTER — Ambulatory Visit (INDEPENDENT_AMBULATORY_CARE_PROVIDER_SITE_OTHER): Payer: BLUE CROSS/BLUE SHIELD | Admitting: Family Medicine

## 2017-07-12 VITALS — BP 160/86 | HR 74 | Resp 16 | Ht 64.0 in | Wt 199.7 lb

## 2017-07-12 DIAGNOSIS — M130 Polyarthritis, unspecified: Secondary | ICD-10-CM | POA: Diagnosis not present

## 2017-07-12 DIAGNOSIS — D691 Qualitative platelet defects: Secondary | ICD-10-CM | POA: Diagnosis not present

## 2017-07-12 DIAGNOSIS — E890 Postprocedural hypothyroidism: Secondary | ICD-10-CM | POA: Diagnosis not present

## 2017-07-12 DIAGNOSIS — E669 Obesity, unspecified: Secondary | ICD-10-CM

## 2017-07-12 DIAGNOSIS — E119 Type 2 diabetes mellitus without complications: Secondary | ICD-10-CM | POA: Insufficient documentation

## 2017-07-12 DIAGNOSIS — I10 Essential (primary) hypertension: Secondary | ICD-10-CM

## 2017-07-12 DIAGNOSIS — R7982 Elevated C-reactive protein (CRP): Secondary | ICD-10-CM

## 2017-07-12 DIAGNOSIS — K5909 Other constipation: Secondary | ICD-10-CM | POA: Diagnosis not present

## 2017-07-12 DIAGNOSIS — D509 Iron deficiency anemia, unspecified: Secondary | ICD-10-CM | POA: Diagnosis not present

## 2017-07-12 DIAGNOSIS — E559 Vitamin D deficiency, unspecified: Secondary | ICD-10-CM

## 2017-07-12 DIAGNOSIS — F320 Major depressive disorder, single episode, mild: Secondary | ICD-10-CM | POA: Diagnosis not present

## 2017-07-12 DIAGNOSIS — E1169 Type 2 diabetes mellitus with other specified complication: Secondary | ICD-10-CM | POA: Diagnosis not present

## 2017-07-12 DIAGNOSIS — R700 Elevated erythrocyte sedimentation rate: Secondary | ICD-10-CM

## 2017-07-12 LAB — POCT GLYCOSYLATED HEMOGLOBIN (HGB A1C): Hemoglobin A1C: 6.4

## 2017-07-12 MED ORDER — OLMESARTAN MEDOXOMIL-HCTZ 40-25 MG PO TABS
1.0000 | ORAL_TABLET | Freq: Every day | ORAL | 1 refills | Status: DC
Start: 2017-07-12 — End: 2018-04-09

## 2017-07-12 MED ORDER — LINACLOTIDE 145 MCG PO CAPS: 145.0000 ug | ORAL_CAPSULE | Freq: Every day | ORAL | 0 refills | Status: DC

## 2017-07-12 NOTE — Progress Notes (Signed)
Name: Shelia Vasquez   MRN: [id number]    DOB: [birth year]   Date:07/12/2017       Progress Note  Subjective  Chief Complaint  Chief Complaint  Patient presents with  . Diabetes  . Hypertension  . Anxiety  . Depression    HPI  DMII: diagnosed back in Dec 2018 with hgb of  6.6%. She is also obese and has family history of DM, she is on life style modification only, had eye exam last week and we will get a copy of results. She is still feeling 52 and going to the bathroom more than usual, no polyphagia. hgbA1C has improved, urine micro last visit was negative.   HTN: not on norvasc because of edema, does not like diuretics because of urinary frequency but willing to try it again. , off clonidine, currently on metoprolol and Benicar and bp is still not at goal. She denies chest pain or palpitation   Major Depression Mild: . States very stressed at work and also missed her father , died 11-26-2017and is still grieving. She also has family discord with siblings but that is getting better, also sad because her 52 yo has autism and does not like getting out of the house, 52 She states she has also been depressed since her teenage years, never tried suicide.   Chronic joint pains: seen by Rheumatologist but missed follow up appointment. She was given prednisone and was supposed to go back for labs. She states prednisone really helped with symptoms, but is back now, with pain on all joints, worse on ankles, feels stiff.   Patient Active Problem List   Diagnosis Date Noted  . Diet-controlled type 2 diabetes mellitus (HCC) 07/12/2017  . Chronic midline low back pain without sciatica 08/24/2016  . Sedimentation rate elevation 07/18/2016  . Elevated C-reactive protein (CRP) 12/09/2015  . Hyperglycemia 12/09/2015  . Anxiety and depression 02/06/2015  . Benign hypertension 02/06/2015  . Dyslipidemia 02/06/2015  . Insomnia 02/06/2015  . Arthralgia 02/06/2015  .  Gastro-esophageal reflux disease without esophagitis 02/06/2015  . History of radioactive iodine thyroid ablation 02/06/2015  . Hypothyroidism, postablative 02/06/2015  . History of iron deficiency anemia 02/06/2015  . Obesity (BMI 30.0-34.9) 02/06/2015  . H/O: hysterectomy 02/06/2015  . Vitamin D deficiency 02/06/2015  . Seasonal allergic rhinitis 02/06/2015  . Chronic constipation 02/06/2015  . History of uterine fibroid 05/29/2013    Past Surgical History:  Procedure Laterality Date  . ABDOMINAL HYSTERECTOMY    . BTL    . CESAREAN SECTION    . COLONOSCOPY WITH PROPOFOL N/A 03/07/2017   Procedure: COLONOSCOPY WITH PROPOFOL;  Surgeon: Midge Minium, MD;  Location: [facility] ENDOSCOPY;  Service: Endoscopy;  Laterality: N/A;  . TUBAL LIGATION      Family History  Problem Relation Age of Onset  . Arthritis Father   . Autism Daughter   . Breast cancer Neg Hx     Social History   Socioeconomic History  . Marital status: Married    Spouse name: Jomarie Longs   . Number of children: 1  . Years of education: Not on file  . Highest education level: Not on file  Social Needs  . Financial resource strain: Not on file  . Food insecurity - worry: Not on file  . Food insecurity - inability: Not on file  . Transportation needs - medical: Not on file  . Transportation needs - non-medical: Not on file  Occupational History  .  Not on file  Tobacco Use  . Smoking status: Never Smoker  . Smokeless tobacco: Never Used  Substance and Sexual Activity  . Alcohol use: No    Alcohol/week: 0.0 oz  . Drug use: No  . Sexual activity: Yes    Partners: Male  Other Topics Concern  . Not on file  Social History Narrative   She is married   Husband has a son   They have a daughter together, she was diagnosed with autism at age 15     Current Outpatient Medications:  .  aspirin 81 MG tablet, Take 1 tablet by mouth daily., Disp: , Rfl:  .  cyclobenzaprine (FLEXERIL) 10 MG tablet, Take 1 tablet (10  mg total) by mouth 3 (three) times daily as needed for muscle spasms., Disp: 30 tablet, Rfl: 0 .  ferrous sulfate 325 (65 FE) MG tablet, Take 325 mg by mouth daily., Disp: , Rfl:  .  fluticasone (FLONASE) 50 MCG/ACT nasal spray, Place 2 sprays into both nostrils daily., Disp: 16 g, Rfl: 6 .  levothyroxine (SYNTHROID, LEVOTHROID) 112 MCG tablet, Take 1 tablet (112 mcg total) by mouth daily., Disp: 90 tablet, Rfl: 1 .  linaclotide (LINZESS) 145 MCG CAPS capsule, Take 1 capsule (145 mcg total) by mouth daily., Disp: 30 capsule, Rfl: 0 .  meloxicam (MOBIC) 15 MG tablet, Take 1 tablet (15 mg total) by mouth daily., Disp: 90 tablet, Rfl: 0 .  metoprolol succinate (TOPROL-XL) 25 MG 24 hr tablet, Take 1 tablet (25 mg total) by mouth daily., Disp: 90 tablet, Rfl: 1 .  ranitidine (ZANTAC) 150 MG capsule, Take by mouth., Disp: , Rfl:  .  Vitamin D, Ergocalciferol, (DRISDOL) 50000 units CAPS capsule, Take 1 capsule (50,000 Units total) by mouth every 7 (seven) days., Disp: 12 capsule, Rfl: 0 .  olmesartan-hydrochlorothiazide (BENICAR HCT) 40-25 MG tablet, Take 1 tablet by mouth daily., Disp: 90 tablet, Rfl: 1  No Known Allergies   ROS  Constitutional: Negative for fever or weight change.  Respiratory: Negative for cough and shortness of breath.   Cardiovascular: Negative for chest pain or palpitations.  Gastrointestinal: Negative for abdominal pain, no bowel changes.  Musculoskeletal: Negative for gait problem or joint swelling.  Skin: Negative for rash.  Neurological: Negative for dizziness or headache.  No other specific complaints in a complete review of systems (except as listed in HPI above).  Objective  Vitals:   07/12/17 0755  BP: (!) 160/110  Pulse: 74  Resp: 16  SpO2: 99%  Weight: 199 lb 11.2 oz (90.6 kg)  Height: 5\' 4"  (1.626 m)    Body mass index is 34.28 kg/m.  Physical Exam  Constitutional: Patient appears well-developed and well-nourished. Obese No distress.  HEENT: head  atraumatic, normocephalic, pupils equal and reactive to light, neck supple, throat within normal limits Cardiovascular: Normal rate, regular rhythm and normal heart sounds.  No murmur heard. Trace  BLE edema. Pulmonary/Chest: Effort normal and breath sounds normal. No respiratory distress. Abdominal: Soft.  There is no tenderness. Psychiatric: Patient has a normal mood and affect. behavior is normal. Judgment and thought content normal. Muscular Skeletal: no synovitis.   Recent Results (from the past 2160 hour(s))  POCT UA - Microalbumin     Status: Normal   Collection Time: 04/13/17 11:33 AM  Result Value Ref Range   Microalbumin Ur, POC neg mg/L   Creatinine, POC neg mg/dL   Albumin/Creatinine Ratio, Urine, POC neg   POCT HgB A1C  Status: Abnormal   Collection Time: 07/12/17  8:05 AM  Result Value Ref Range   Hemoglobin A1C 6.4      PHQ2/9: [facility] 07/12/2017 12/08/2015 07/17/2015 02/06/2015  Decreased Interest 0 0 0 0  Down, Depressed, Hopeless 2 - 0 0  PHQ - 2 Score 2 0 0 0  Altered sleeping 2 - - -  Tired, decreased energy 2 - - -  Change in appetite 2 - - -  Feeling bad or failure about yourself  0 - - -  Trouble concentrating 0 - - -  Moving slowly or fidgety/restless 0 - - -  Suicidal thoughts 0 - - -  PHQ-9 Score 8 - - -  Difficult doing work/chores Somewhat difficult - - -     Fall Risk: Fall Risk  07/12/2017 04/13/2017 03/29/2017 12/08/2015 07/17/2015  Falls in the past year? No No No No No    Functional Status Survey: Is the patient deaf or have difficulty hearing?: No Does the patient have difficulty seeing, even when wearing glasses/contacts?: No Does the patient have difficulty concentrating, remembering, or making decisions?: No Does the patient have difficulty walking or climbing stairs?: No Does the patient have difficulty dressing or bathing?: No Does the patient have difficulty doing errands alone such as visiting a doctor's office or  shopping?: No   Assessment & Plan  1. Diabetes mellitus type 2 in obese (HCC)  - POCT HgB A1C  2. Uncontrolled hypertension  We will add HCTZ - olmesartan-hydrochlorothiazide (BENICAR HCT) 40-25 MG tablet; Take 1 tablet by mouth daily.  Dispense: 90 tablet; Refill: 1  3. Chronic constipation  - linaclotide (LINZESS) 145 MCG CAPS capsule; Take 1 capsule (145 mcg total) by mouth daily.  Dispense: 30 capsule; Refill: 0  4. Mild major depression (HCC)  Offered medication or therapy, but she refuses at this time Discussed joining an autism support group   5. Thrombocytopathia (HCC)  - CBC with Differential/Platelet  6. Hypothyroidism, postablative  - TSH  7. Iron deficiency anemia, unspecified iron deficiency anemia type  - CBC with Differential/Platelet - Iron, TIBC and Ferritin Panel; Future - Iron, TIBC and Ferritin Panel  8. Vitamin D deficiency  Continue medication  9. Chronic polyarthritis  - ANA,IFA RA Diag Pnl w/rflx Tit/Patn Taking Meloxicam prn  -uric acid   10. Sedimentation rate elevation  - ANA,IFA RA Diag Pnl w/rflx Tit/Patn - Sedimentation rate  11. Elevated C-reactive protein (CRP)  - ANA,IFA RA Diag Pnl w/rflx Tit/Patn - C-reactive protein

## 2017-07-12 NOTE — Patient Instructions (Signed)

## 2017-07-14 LAB — CBC WITH DIFFERENTIAL/PLATELET
BASOS PCT: 0.6 %
Basophils Absolute: 37 cells/uL (ref 0–200)
Eosinophils Absolute: 99 cells/uL (ref 15–500)
Eosinophils Relative: 1.6 %
HCT: 34.8 % — ABNORMAL LOW (ref 35.0–45.0)
Hemoglobin: 11.6 g/dL — ABNORMAL LOW (ref 11.7–15.5)
Lymphs Abs: 2176 cells/uL (ref 850–3900)
MCH: 25.6 pg — ABNORMAL LOW (ref 27.0–33.0)
MCHC: 33.3 g/dL (ref 32.0–36.0)
MCV: 76.7 fL — AB (ref 80.0–100.0)
MPV: 9.9 fL (ref 7.5–12.5)
Monocytes Relative: 7.5 %
NEUTROS PCT: 55.2 %
Neutro Abs: 3422 cells/uL (ref 1500–7800)
PLATELETS: 378 10*3/uL (ref 140–400)
RBC: 4.54 10*6/uL (ref 3.80–5.10)
RDW: 15.6 % — AB (ref 11.0–15.0)
TOTAL LYMPHOCYTE: 35.1 %
WBC: 6.2 10*3/uL (ref 3.8–10.8)
WBCMIX: 465 {cells}/uL (ref 200–950)

## 2017-07-14 LAB — IRON,TIBC AND FERRITIN PANEL
%SAT: 14 % (calc) (ref 11–50)
FERRITIN: 36 ng/mL (ref 10–232)
Iron: 51 ug/dL (ref 45–160)
TIBC: 370 mcg/dL (calc) (ref 250–450)

## 2017-07-14 LAB — C-REACTIVE PROTEIN: CRP: 20.3 mg/L — ABNORMAL HIGH (ref <8.0)

## 2017-07-14 LAB — URIC ACID: URIC ACID, SERUM: 5.6 mg/dL (ref 2.5–7.0)

## 2017-07-14 LAB — ANTI-NUCLEAR AB-TITER (ANA TITER)

## 2017-07-14 LAB — ANA,IFA RA DIAG PNL W/RFLX TIT/PATN: ANA: POSITIVE — AB

## 2017-07-14 LAB — TSH: TSH: 1.23 mIU/L

## 2017-07-14 LAB — SEDIMENTATION RATE: Sed Rate: 50 mm/h — ABNORMAL HIGH (ref 0–30)

## 2017-07-18 ENCOUNTER — Other Ambulatory Visit: Payer: Self-pay | Admitting: Family Medicine

## 2017-07-18 DIAGNOSIS — R700 Elevated erythrocyte sedimentation rate: Secondary | ICD-10-CM

## 2017-07-18 DIAGNOSIS — R768 Other specified abnormal immunological findings in serum: Secondary | ICD-10-CM

## 2017-07-18 DIAGNOSIS — M2550 Pain in unspecified joint: Secondary | ICD-10-CM

## 2017-07-18 NOTE — Progress Notes (Unsigned)
heu

## 2017-07-26 DIAGNOSIS — M545 Low back pain: Secondary | ICD-10-CM | POA: Diagnosis not present

## 2017-07-26 DIAGNOSIS — M25562 Pain in left knee: Secondary | ICD-10-CM | POA: Diagnosis not present

## 2017-07-26 DIAGNOSIS — G8929 Other chronic pain: Secondary | ICD-10-CM | POA: Diagnosis not present

## 2017-07-26 DIAGNOSIS — R7982 Elevated C-reactive protein (CRP): Secondary | ICD-10-CM | POA: Diagnosis not present

## 2017-07-26 DIAGNOSIS — R768 Other specified abnormal immunological findings in serum: Secondary | ICD-10-CM | POA: Insufficient documentation

## 2017-07-26 DIAGNOSIS — M2550 Pain in unspecified joint: Secondary | ICD-10-CM | POA: Diagnosis not present

## 2017-07-26 DIAGNOSIS — R918 Other nonspecific abnormal finding of lung field: Secondary | ICD-10-CM | POA: Diagnosis not present

## 2017-07-26 DIAGNOSIS — R700 Elevated erythrocyte sedimentation rate: Secondary | ICD-10-CM | POA: Diagnosis not present

## 2017-07-26 DIAGNOSIS — M533 Sacrococcygeal disorders, not elsewhere classified: Secondary | ICD-10-CM | POA: Diagnosis not present

## 2017-07-28 ENCOUNTER — Other Ambulatory Visit: Payer: Self-pay | Admitting: Family Medicine

## 2017-07-28 DIAGNOSIS — E119 Type 2 diabetes mellitus without complications: Secondary | ICD-10-CM

## 2017-07-28 DIAGNOSIS — I10 Essential (primary) hypertension: Secondary | ICD-10-CM

## 2017-07-31 ENCOUNTER — Other Ambulatory Visit: Payer: Self-pay

## 2017-08-06 ENCOUNTER — Other Ambulatory Visit: Payer: Self-pay | Admitting: Family Medicine

## 2017-08-09 ENCOUNTER — Ambulatory Visit (INDEPENDENT_AMBULATORY_CARE_PROVIDER_SITE_OTHER): Payer: BLUE CROSS/BLUE SHIELD

## 2017-08-09 VITALS — BP 140/100 | Ht 64.0 in | Wt 199.0 lb

## 2017-08-09 DIAGNOSIS — M064 Inflammatory polyarthropathy: Secondary | ICD-10-CM | POA: Insufficient documentation

## 2017-08-09 DIAGNOSIS — R7982 Elevated C-reactive protein (CRP): Secondary | ICD-10-CM | POA: Diagnosis not present

## 2017-08-09 DIAGNOSIS — I10 Essential (primary) hypertension: Secondary | ICD-10-CM

## 2017-08-09 DIAGNOSIS — R768 Other specified abnormal immunological findings in serum: Secondary | ICD-10-CM | POA: Diagnosis not present

## 2017-08-09 DIAGNOSIS — M1990 Unspecified osteoarthritis, unspecified site: Secondary | ICD-10-CM | POA: Diagnosis not present

## 2017-08-09 NOTE — Progress Notes (Addendum)
Patient is here for a blood pressure check. Patient denies chest pain, palpitations, shortness of breath or visual disturbances. At previous visit blood pressure was 160/86 with a heart rate of 74. Today during nurse visit first check blood pressure was 140/100. After resting for 10 minutes it was 140/100 and heart rate 62 was. She continues to take Metoprolol and Benicar as prescribed.  I will increase dose of Metoprolol

## 2017-08-10 MED ORDER — METOPROLOL SUCCINATE ER 50 MG PO TB24: 50.0000 mg | ORAL_TABLET | Freq: Every day | ORAL | 0 refills | Status: DC

## 2017-08-10 NOTE — Addendum Note (Signed)
Addended by: Alba CorySOWLES, [place] Surges F on: 08/10/2017 01:57 PM   Modules accepted: Orders

## 2017-08-11 ENCOUNTER — Ambulatory Visit: Payer: BLUE CROSS/BLUE SHIELD

## 2017-08-25 ENCOUNTER — Other Ambulatory Visit: Payer: Self-pay | Admitting: Family Medicine

## 2017-09-20 ENCOUNTER — Other Ambulatory Visit: Payer: Self-pay | Admitting: Family Medicine

## 2017-09-20 DIAGNOSIS — E559 Vitamin D deficiency, unspecified: Secondary | ICD-10-CM

## 2017-09-20 NOTE — Telephone Encounter (Signed)
Refill request for general medication. Vitamin D   Last office visit: 07/12/2017   Follow up on 10/12/2017

## 2017-10-09 ENCOUNTER — Other Ambulatory Visit: Payer: Self-pay | Admitting: Family Medicine

## 2017-10-09 DIAGNOSIS — I10 Essential (primary) hypertension: Secondary | ICD-10-CM

## 2017-10-12 ENCOUNTER — Ambulatory Visit (INDEPENDENT_AMBULATORY_CARE_PROVIDER_SITE_OTHER): Payer: BLUE CROSS/BLUE SHIELD | Admitting: Family Medicine

## 2017-10-12 ENCOUNTER — Encounter: Payer: Self-pay | Admitting: Family Medicine

## 2017-10-12 VITALS — BP 130/96 | HR 67 | Resp 16 | Ht 64.0 in | Wt 197.1 lb

## 2017-10-12 DIAGNOSIS — E890 Postprocedural hypothyroidism: Secondary | ICD-10-CM | POA: Diagnosis not present

## 2017-10-12 DIAGNOSIS — M064 Inflammatory polyarthropathy: Secondary | ICD-10-CM | POA: Diagnosis not present

## 2017-10-12 DIAGNOSIS — E1169 Type 2 diabetes mellitus with other specified complication: Secondary | ICD-10-CM

## 2017-10-12 DIAGNOSIS — E785 Hyperlipidemia, unspecified: Secondary | ICD-10-CM | POA: Diagnosis not present

## 2017-10-12 DIAGNOSIS — E669 Obesity, unspecified: Secondary | ICD-10-CM | POA: Diagnosis not present

## 2017-10-12 DIAGNOSIS — R768 Other specified abnormal immunological findings in serum: Secondary | ICD-10-CM | POA: Diagnosis not present

## 2017-10-12 DIAGNOSIS — E559 Vitamin D deficiency, unspecified: Secondary | ICD-10-CM

## 2017-10-12 DIAGNOSIS — I10 Essential (primary) hypertension: Secondary | ICD-10-CM | POA: Diagnosis not present

## 2017-10-12 DIAGNOSIS — M1380 Other specified arthritis, unspecified site: Secondary | ICD-10-CM

## 2017-10-12 DIAGNOSIS — F320 Major depressive disorder, single episode, mild: Secondary | ICD-10-CM | POA: Diagnosis not present

## 2017-10-12 DIAGNOSIS — R7689 Other specified abnormal immunological findings in serum: Secondary | ICD-10-CM

## 2017-10-12 LAB — POCT GLYCOSYLATED HEMOGLOBIN (HGB A1C): HEMOGLOBIN A1C: 6.7 % — AB (ref 4.0–5.6)

## 2017-10-12 LAB — HM DIABETES EYE EXAM

## 2017-10-12 MED ORDER — DULOXETINE HCL 30 MG PO CPEP: 30.0000 mg | ORAL_CAPSULE | Freq: Every day | ORAL | 0 refills | Status: DC

## 2017-10-12 MED ORDER — LEVOTHYROXINE SODIUM 112 MCG PO TABS: 112.0000 ug | ORAL_TABLET | Freq: Every day | ORAL | 1 refills | Status: DC

## 2017-10-12 NOTE — Progress Notes (Signed)
Name: Shelia Vasquez   MRN: [id number]    DOB: 07-19-1965   Date:10/12/2017       Progress Note  Subjective  Chief Complaint  Chief Complaint  Patient presents with  . Hypertension  . Gastroesophageal Reflux  . Hypothyroidism  . Anxiety  . Depression  . Hyperglycemia    HPI    DMII: diagnosed back in Dec 2018 with hgb of  6.6%. She is also obese and has family history of DM, she is on life style modification only, eye exam is up to date.  She denies polyphagia, polydipsia or polyuria at this time. HgbA1C still at goal   HTN: not on norvasc because of edema, she is on Benicar, metoprolol and we added HCTZ on her last visit, DBP is still elevated. She will monitor at home. Explained that if remains high we may need to switch to bystolic since heart rate is towards low end of normal.   Major Depression Mild: . States very stressed at work and also missed her father , died 11/16/17and is still grieving. also sad because her 52 yo has autism and does not like getting out of the house, she feels trapped at times. She states she has also been depressed since her teenage years, never tried suicide. She also lost a close friend this past. Discussed grieving counseling and medication again. Explained that Duloxetine may help with pain also. She is willing to try it.   Inflammatory arthritis : seen by Rheumatologist  now on Plaquenil, tolerating medication well. Eye exam is up to date and we will obtain a copy. Off prednisone. She states no current joint swelling, pain is down to 5/10.   Hypothyroidism: weight is stable, constipation still present, states Linzess too expensive, we will give her a voucher today.   GERD: taking Ranitidine prn, states occasionally has dysphagia, food gets stuck on upper esophagus, discussed referral to GI but she would like to try chewing more and monitor for now.   Patient Active Problem List   Diagnosis Date Noted  . Undifferentiated inflammatory arthritis  (HCC) 08/09/2017  . Positive ANA (antinuclear antibody) 07/26/2017  . Diet-controlled type 2 diabetes mellitus (HCC) 07/12/2017  . Chronic midline low back pain without sciatica 08/24/2016  . Sedimentation rate elevation 07/18/2016  . Elevated C-reactive protein (CRP) 12/09/2015  . Hyperglycemia 12/09/2015  . Benign hypertension 02/06/2015  . Dyslipidemia 02/06/2015  . Insomnia 02/06/2015  . Gastro-esophageal reflux disease without esophagitis 02/06/2015  . History of radioactive iodine thyroid ablation 02/06/2015  . Hypothyroidism, postablative 02/06/2015  . History of iron deficiency anemia 02/06/2015  . Obesity (BMI 30.0-34.9) 02/06/2015  . H/O: hysterectomy 02/06/2015  . Vitamin D deficiency 02/06/2015  . Seasonal allergic rhinitis 02/06/2015  . Chronic constipation 02/06/2015  . History of uterine fibroid 05/29/2013    Past Surgical History:  Procedure Laterality Date  . ABDOMINAL HYSTERECTOMY    . BTL    . CESAREAN SECTION    . COLONOSCOPY WITH PROPOFOL N/A 03/07/2017   Procedure: COLONOSCOPY WITH PROPOFOL;  Surgeon: Midge Minium, MD;  Location: [facility] ENDOSCOPY;  Service: Endoscopy;  Laterality: N/A;  . TUBAL LIGATION      Family History  Problem Relation Age of Onset  . Arthritis Father   . Autism Daughter   . Breast cancer Neg Hx     Social History   Socioeconomic History  . Marital status: Married    Spouse name: Jomarie Longs   . Number of children: 1  .  Years of education: Not on file  . Highest education level: Not on file  Occupational History  . Not on file  Social Needs  . Financial resource strain: Not on file  . Food insecurity:    Worry: Not on file    Inability: Not on file  . Transportation needs:    Medical: Not on file    Non-medical: Not on file  Tobacco Use  . Smoking status: Never Smoker  . Smokeless tobacco: Never Used  Substance and Sexual Activity  . Alcohol use: No    Alcohol/week: 0.0 oz  . Drug use: No  . Sexual activity: Yes     Partners: Male  Lifestyle  . Physical activity:    Days per week: Not on file    Minutes per session: Not on file  . Stress: Not on file  Relationships  . Social connections:    Talks on phone: Not on file    Gets together: Not on file    Attends religious service: Not on file    Active member of club or organization: Not on file    Attends meetings of clubs or organizations: Not on file    Relationship status: Not on file  . Intimate partner violence:    Fear of current or ex partner: Not on file    Emotionally abused: Not on file    Physically abused: Not on file    Forced sexual activity: Not on file  Other Topics Concern  . Not on file  Social History Narrative   She is married   Husband has a son   They have a daughter together, she was diagnosed with autism at age 37     Current Outpatient Medications:  .  ferrous sulfate 325 (65 FE) MG tablet, Take 325 mg by mouth daily., Disp: , Rfl:  .  fluticasone (FLONASE) 50 MCG/ACT nasal spray, Place 2 sprays into both nostrils daily., Disp: 16 g, Rfl: 6 .  hydroxychloroquine (PLAQUENIL) 200 MG tablet, Take 1 tablet by mouth 2 (two) times daily., Disp: , Rfl:  .  ibuprofen (ADVIL,MOTRIN) 600 MG tablet, Take by mouth., Disp: , Rfl:  .  levothyroxine (SYNTHROID, LEVOTHROID) 112 MCG tablet, Take 1 tablet (112 mcg total) by mouth daily., Disp: 90 tablet, Rfl: 1 .  linaclotide (LINZESS) 145 MCG CAPS capsule, Take 1 capsule (145 mcg total) by mouth daily., Disp: 30 capsule, Rfl: 0 .  metoprolol succinate (TOPROL-XL) 50 MG 24 hr tablet, Take 1 tablet (50 mg total) by mouth daily., Disp: 90 tablet, Rfl: 0 .  olmesartan-hydrochlorothiazide (BENICAR HCT) 40-25 MG tablet, Take 1 tablet by mouth daily., Disp: 90 tablet, Rfl: 1 .  ranitidine (ZANTAC) 150 MG capsule, Take by mouth., Disp: , Rfl:  .  Vitamin D, Ergocalciferol, (DRISDOL) 50000 units CAPS capsule, TAKE 1 CAPSULE (50,000 UNITS TOTAL) BY MOUTH EVERY 7 (SEVEN) DAYS., Disp: 12 capsule,  Rfl: 0  No Known Allergies   ROS  Constitutional: Negative for fever or weight change.  Respiratory: Negative for cough and shortness of breath.   Cardiovascular: Negative for chest pain or palpitations.  Gastrointestinal: Negative for abdominal pain, no bowel changes.  Musculoskeletal: Negative for gait problem , denies  joint swelling.  Skin: Negative for rash.  Neurological: Negative for dizziness or headache.  No other specific complaints in a complete review of systems (except as listed in HPI above).  Objective  Vitals:   10/12/17 0903  BP: (!) 130/96  Pulse: 67  Resp:  16  SpO2: 98%  Weight: 197 lb 1.6 oz (89.4 kg)  Height: 5\' 4"  (1.626 m)    Body mass index is 33.83 kg/m.  Physical Exam  Constitutional: Patient appears well-developed and well-nourished. Obese  No distress.  HEENT: head atraumatic, normocephalic, pupils equal and reactive to light, neck supple, throat within normal limits Cardiovascular: Normal rate, regular rhythm and normal heart sounds.  No murmur heard. No BLE edema. Pulmonary/Chest: Effort normal and breath sounds normal. No respiratory distress. Abdominal: Soft.  There is no tenderness. Psychiatric: Patient has a normal mood and affect. behavior is normal. Judgment and thought content normal.  Recent Results (from the past 2160 hour(s))  POCT HgB A1C     Status: Abnormal   Collection Time: 10/12/17  9:18 AM  Result Value Ref Range   Hemoglobin A1C 6.7 (A) 4.0 - 5.6 %   HbA1c, POC (prediabetic range)  5.7 - 6.4 %   HbA1c, POC (controlled diabetic range)  0.0 - 7.0 %      PHQ2/9: [facility] 10/12/2017 07/12/2017 12/08/2015 07/17/2015 02/06/2015  Decreased Interest 2 0 0 0 0  Down, Depressed, Hopeless 2 2 - 0 0  PHQ - 2 Score 4 2 0 0 0  Altered sleeping 2 2 - - -  Tired, decreased energy 2 2 - - -  Change in appetite 2 2 - - -  Feeling bad or failure about yourself  0 0 - - -  Trouble concentrating 1 0 - - -  Moving  slowly or fidgety/restless 0 0 - - -  Suicidal thoughts 0 0 - - -  PHQ-9 Score 11 8 - - -  Difficult doing work/chores Not difficult at all Somewhat difficult - - -     Fall Risk: Fall Risk  10/12/2017 07/12/2017 04/13/2017 03/29/2017 12/08/2015  Falls in the past year? No No No No No     Assessment & Plan   1. Diabetes mellitus type 2 in obese (HCC)  - POCT HgB A1C  2. Benign Hypertension   We will monitor for now  3. Elevated antinuclear antibody (ANA) level  Seeing rheumatologist   4. Mild major depression (HCC)  She is willing to try medication, discussed possible side effects - DULoxetine (CYMBALTA) 30 MG capsule; Take 1-2 capsules (30-60 mg total) by mouth daily. Daily for the first week, after that  60 mg daily  Dispense: 60 capsule; Refill: 0  5. Hypothyroidism, postablative  - levothyroxine (SYNTHROID, LEVOTHROID) 112 MCG tablet; Take 1 tablet (112 mcg total) by mouth daily.  Dispense: 90 tablet; Refill: 1  6. Vitamin D deficiency  Continue supplementation   7. Dyslipidemia  Reviewed labs, per ASA calculation , not beneficial to continue taking it. We will stop aspirin for now   8. Undifferentiated inflammatory arthritis (HCC)  Seeing Dr. Renard MatterBock

## 2017-10-13 ENCOUNTER — Encounter: Payer: Self-pay | Admitting: Family Medicine

## 2017-10-30 DIAGNOSIS — R7982 Elevated C-reactive protein (CRP): Secondary | ICD-10-CM | POA: Diagnosis not present

## 2017-10-30 DIAGNOSIS — R700 Elevated erythrocyte sedimentation rate: Secondary | ICD-10-CM | POA: Diagnosis not present

## 2017-10-30 DIAGNOSIS — M1990 Unspecified osteoarthritis, unspecified site: Secondary | ICD-10-CM | POA: Diagnosis not present

## 2017-10-30 DIAGNOSIS — R768 Other specified abnormal immunological findings in serum: Secondary | ICD-10-CM | POA: Diagnosis not present

## 2017-10-30 DIAGNOSIS — Z79899 Other long term (current) drug therapy: Secondary | ICD-10-CM | POA: Diagnosis not present

## 2017-11-01 ENCOUNTER — Encounter: Payer: Self-pay | Admitting: Family Medicine

## 2017-11-06 ENCOUNTER — Other Ambulatory Visit: Payer: Self-pay | Admitting: Family Medicine

## 2017-11-06 DIAGNOSIS — F320 Major depressive disorder, single episode, mild: Secondary | ICD-10-CM

## 2017-11-06 NOTE — Telephone Encounter (Signed)
Refill request was sent to Dr. Krichna Sowles for approval and submission.  

## 2017-11-21 ENCOUNTER — Ambulatory Visit: Payer: BLUE CROSS/BLUE SHIELD | Admitting: Family Medicine

## 2017-12-03 ENCOUNTER — Other Ambulatory Visit: Payer: Self-pay | Admitting: Family Medicine

## 2017-12-03 DIAGNOSIS — I10 Essential (primary) hypertension: Secondary | ICD-10-CM

## 2017-12-27 ENCOUNTER — Other Ambulatory Visit: Payer: Self-pay | Admitting: Family Medicine

## 2017-12-27 DIAGNOSIS — E559 Vitamin D deficiency, unspecified: Secondary | ICD-10-CM

## 2018-01-09 ENCOUNTER — Other Ambulatory Visit: Payer: Self-pay | Admitting: Obstetrics and Gynecology

## 2018-01-09 DIAGNOSIS — Z01419 Encounter for gynecological examination (general) (routine) without abnormal findings: Secondary | ICD-10-CM | POA: Diagnosis not present

## 2018-01-09 DIAGNOSIS — Z1231 Encounter for screening mammogram for malignant neoplasm of breast: Secondary | ICD-10-CM | POA: Diagnosis not present

## 2018-01-15 ENCOUNTER — Ambulatory Visit: Payer: BLUE CROSS/BLUE SHIELD | Admitting: Family Medicine

## 2018-02-05 ENCOUNTER — Ambulatory Visit
Admission: RE | Admit: 2018-02-05 | Discharge: 2018-02-05 | Disposition: A | Discharge status: Home / Self Care | Payer: BLUE CROSS/BLUE SHIELD | Source: Ambulatory Visit | Attending: Obstetrics and Gynecology | Admitting: Obstetrics and Gynecology

## 2018-02-05 DIAGNOSIS — Z1231 Encounter for screening mammogram for malignant neoplasm of breast: Secondary | ICD-10-CM | POA: Diagnosis not present

## 2018-02-12 ENCOUNTER — Encounter: Payer: Self-pay | Admitting: Family Medicine

## 2018-02-16 ENCOUNTER — Ambulatory Visit: Payer: BLUE CROSS/BLUE SHIELD | Admitting: Family Medicine

## 2018-02-16 ENCOUNTER — Encounter

## 2018-02-16 ENCOUNTER — Encounter: Payer: Self-pay | Admitting: Family Medicine

## 2018-02-16 VITALS — BP 134/84 | HR 66 | Temp 98.0°F | Resp 16 | Ht 64.0 in | Wt 193.8 lb

## 2018-02-16 DIAGNOSIS — E1169 Type 2 diabetes mellitus with other specified complication: Secondary | ICD-10-CM

## 2018-02-16 DIAGNOSIS — E785 Hyperlipidemia, unspecified: Secondary | ICD-10-CM

## 2018-02-16 DIAGNOSIS — I10 Essential (primary) hypertension: Secondary | ICD-10-CM | POA: Diagnosis not present

## 2018-02-16 DIAGNOSIS — R768 Other specified abnormal immunological findings in serum: Secondary | ICD-10-CM

## 2018-02-16 DIAGNOSIS — D509 Iron deficiency anemia, unspecified: Secondary | ICD-10-CM

## 2018-02-16 DIAGNOSIS — Z23 Encounter for immunization: Secondary | ICD-10-CM

## 2018-02-16 DIAGNOSIS — E559 Vitamin D deficiency, unspecified: Secondary | ICD-10-CM | POA: Diagnosis not present

## 2018-02-16 DIAGNOSIS — R1313 Dysphagia, pharyngeal phase: Secondary | ICD-10-CM

## 2018-02-16 DIAGNOSIS — E669 Obesity, unspecified: Secondary | ICD-10-CM | POA: Diagnosis not present

## 2018-02-16 DIAGNOSIS — D691 Qualitative platelet defects: Secondary | ICD-10-CM

## 2018-02-16 DIAGNOSIS — F325 Major depressive disorder, single episode, in full remission: Secondary | ICD-10-CM

## 2018-02-16 DIAGNOSIS — E890 Postprocedural hypothyroidism: Secondary | ICD-10-CM

## 2018-02-16 DIAGNOSIS — K5909 Other constipation: Secondary | ICD-10-CM

## 2018-02-16 DIAGNOSIS — K219 Gastro-esophageal reflux disease without esophagitis: Secondary | ICD-10-CM

## 2018-02-16 DIAGNOSIS — M064 Inflammatory polyarthropathy: Secondary | ICD-10-CM

## 2018-02-16 LAB — POCT GLYCOSYLATED HEMOGLOBIN (HGB A1C): Hemoglobin A1C: 6.3 % — AB (ref 4.0–5.6)

## 2018-02-16 NOTE — Progress Notes (Signed)
Name: Shelia Vasquez   MRN: [id number]    DOB: 26-Aug-1965   Date:02/16/2018       Progress Note  Subjective  Chief Complaint  Chief Complaint  Patient presents with  . Follow-up    3 mth f/u  . Diabetes  . Hypertension  . Depression  . Hypothyroidism  . Dyslipidemia  . Arthritis  . Immunizations    flu shot    HPI  DMII:diagnosed back in Dec 2018 with hgb of6.6%. She is also obese and has family history of DM, she is on life style modification only, eye exam is up to date.  She denies polyphagia, polydipsia or polyuria at this time.We will check urine micro on her next visit. She has lost 4 lbs and trying to follow a diabetic diet  HTN: not on norvasc because of edema, she is on Benicar/HCTZ and metoprolol and bp today is at goal.  She states bp at home has been mostly around 130's/80's, occasionally goes up to 150's /90's. Denies headaches, chest pain or dizziness  Major Depression in Remission: She has a long history of depression - since her teenage years. Symptoms got worse in 2017 when her father. She has a 24 yo daughter with  autism and does not like getting out of the house, she feels trapped at times. She tried Duloxetine this past Summer, but she states she is feeling better and stopped medication on her own.   Inflammatory arthritis : seen by Rheumatologist now on Plaquenil, tolerating medication well. Eye exam is up to date and we will obtain a copy. Off prednisone. She states pain is mild- moderate, on hands and her lower back, 6/10 , described as aching. No swelling at this time  Hypothyroidism: she lost 4 lbs since last visit,  constipation still present, states Linzess too expensive, we will give her a voucher today.   GERD: taking Ranitidine prn, states occasionally has dysphagia, food gets stuck on upper esophagus, discussed referral to GI but she would like to try chewing more and monitor for now, she states she is worried about cost and taking time off  work.    Patient Active Problem List   Diagnosis Date Noted  . Undifferentiated inflammatory arthritis (HCC) 08/09/2017  . Positive ANA (antinuclear antibody) 07/26/2017  . Diet-controlled type 2 diabetes mellitus (HCC) 07/12/2017  . Chronic midline low back pain without sciatica 08/24/2016  . Sedimentation rate elevation 07/18/2016  . Elevated C-reactive protein (CRP) 12/09/2015  . Hyperglycemia 12/09/2015  . Benign hypertension 02/06/2015  . Dyslipidemia 02/06/2015  . Insomnia 02/06/2015  . Gastro-esophageal reflux disease without esophagitis 02/06/2015  . History of radioactive iodine thyroid ablation 02/06/2015  . Hypothyroidism, postablative 02/06/2015  . History of iron deficiency anemia 02/06/2015  . Obesity (BMI 30.0-34.9) 02/06/2015  . H/O: hysterectomy 02/06/2015  . Vitamin D deficiency 02/06/2015  . Seasonal allergic rhinitis 02/06/2015  . Chronic constipation 02/06/2015  . History of uterine fibroid 05/29/2013    Past Surgical History:  Procedure Laterality Date  . ABDOMINAL HYSTERECTOMY    . BTL    . CESAREAN SECTION    . COLONOSCOPY WITH PROPOFOL N/A 03/07/2017   Procedure: COLONOSCOPY WITH PROPOFOL;  Surgeon: Midge Minium, MD;  Location: [facility] ENDOSCOPY;  Service: Endoscopy;  Laterality: N/A;  . TUBAL LIGATION      Family History  Problem Relation Age of Onset  . Arthritis Father   . Autism Daughter   . Breast cancer Neg Hx     Social History  Socioeconomic History  . Marital status: Married    Spouse name: Jomarie Longs   . Number of children: 1  . Years of education: Not on file  . Highest education level: Some college, no degree  Occupational History  . Not on file  Social Needs  . Financial resource strain: Not hard at all  . Food insecurity:    Worry: Never true    Inability: Never true  . Transportation needs:    Medical: No    Non-medical: No  Tobacco Use  . Smoking status: Never Smoker  . Smokeless tobacco: Never Used  Substance and  Sexual Activity  . Alcohol use: No    Alcohol/week: 0.0 standard drinks  . Drug use: No  . Sexual activity: Yes    Partners: Male  Lifestyle  . Physical activity:    Days per week: 0 days    Minutes per session: 0 min  . Stress: Rather much  Relationships  . Social connections:    Talks on phone: Three times a week    Gets together: Three times a week    Attends religious service: More than 4 times per year    Active member of club or organization: No    Attends meetings of clubs or organizations: Never    Relationship status: Married  . Intimate partner violence:    Fear of current or ex partner: No    Emotionally abused: No    Physically abused: No    Forced sexual activity: No  Other Topics Concern  . Not on file  Social History Narrative   She is married   Husband has a son   They have a daughter together, she was diagnosed with autism at age 89     Current Outpatient Medications:  .  ferrous sulfate 325 (65 FE) MG tablet, Take 325 mg by mouth daily., Disp: , Rfl:  .  fluticasone (FLONASE) 50 MCG/ACT nasal spray, Place 2 sprays into both nostrils daily., Disp: 16 g, Rfl: 6 .  hydroxychloroquine (PLAQUENIL) 200 MG tablet, Take 1 tablet by mouth 2 (two) times daily., Disp: , Rfl:  .  ibuprofen (ADVIL,MOTRIN) 600 MG tablet, Take by mouth., Disp: , Rfl:  .  levothyroxine (SYNTHROID, LEVOTHROID) 112 MCG tablet, Take 1 tablet (112 mcg total) by mouth daily., Disp: 90 tablet, Rfl: 1 .  linaclotide (LINZESS) 145 MCG CAPS capsule, Take 1 capsule (145 mcg total) by mouth daily., Disp: 30 capsule, Rfl: 0 .  metoprolol succinate (TOPROL-XL) 25 MG 24 hr tablet, TAKE 1 TABLET BY MOUTH EVERY DAY, Disp: 90 tablet, Rfl: 1 .  olmesartan-hydrochlorothiazide (BENICAR HCT) 40-25 MG tablet, Take 1 tablet by mouth daily., Disp: 90 tablet, Rfl: 1 .  Vitamin D, Ergocalciferol, (DRISDOL) 50000 units CAPS capsule, TAKE 1 CAPSULE (50,000 UNITS TOTAL) BY MOUTH EVERY 7 (SEVEN) DAYS., Disp: 12  capsule, Rfl: 0 .  aspirin EC 81 MG tablet, Take 1 tablet by mouth daily., Disp: , Rfl:  .  ranitidine (ZANTAC) 150 MG capsule, Take by mouth., Disp: , Rfl:   No Known Allergies  I personally reviewed active problem list, medication list, allergies, family history, social history with the patient/caregiver today.   ROS  Constitutional: Negative for fever or significant weight change.  Respiratory: Negative for cough and shortness of breath.   Cardiovascular: Negative for chest pain or palpitations.  Gastrointestinal: Negative for abdominal pain, no bowel changes.  Musculoskeletal: Negative for gait problem or joint swelling.  Skin: Negative for rash.  Neurological:  Negative for dizziness or headache.  No other specific complaints in a complete review of systems (except as listed in HPI above).   Objective  Vitals:   02/16/18 1000  BP: 134/84  Pulse: 66  Resp: 16  Temp: 98 F (36.7 C)  TempSrc: Oral  SpO2: 95%  Weight: 193 lb 12.8 oz (87.9 kg)  Height: 5\' 4"  (1.626 m)    Body mass index is 33.27 kg/m.  Physical Exam  Constitutional: Patient appears well-developed and well-nourished. Obese No distress.  HEENT: head atraumatic, normocephalic, pupils equal and reactive to light,  neck supple, throat within normal limits Cardiovascular: Normal rate, regular rhythm and normal heart sounds.  No murmur heard. No BLE edema. Pulmonary/Chest: Effort normal and breath sounds normal. No respiratory distress. Abdominal: Soft.  There is no tenderness. Psychiatric: Patient has a normal mood and affect. behavior is normal. Judgment and thought content normal. Muscular skeletal: no synovitis today   PHQ2/9: Depression screen [facility] 2/9 02/16/2018 10/12/2017 07/12/2017 12/08/2015 07/17/2015  Decreased Interest 0 2 0 0 0  Down, Depressed, Hopeless 0 2 2 - 0  PHQ - 2 Score 0 4 2 0 0  Altered sleeping 1 2 2  - -  Tired, decreased energy 3 2 2  - -  Change in appetite 0 2 2 - -  Feeling bad  or failure about yourself  0 0 0 - -  Trouble concentrating 0 1 0 - -  Moving slowly or fidgety/restless 0 0 0 - -  Suicidal thoughts 0 0 0 - -  PHQ-9 Score 4 11 8  - -  Difficult doing work/chores Not difficult at all Not difficult at all Somewhat difficult - -    Fall Risk: Fall Risk  02/16/2018 10/12/2017 07/12/2017 04/13/2017 03/29/2017  Falls in the past year? No No No No No    Functional Status Survey: Is the patient deaf or have difficulty hearing?: No Does the patient have difficulty seeing, even when wearing glasses/contacts?: No Does the patient have difficulty concentrating, remembering, or making decisions?: No Does the patient have difficulty walking or climbing stairs?: No Does the patient have difficulty dressing or bathing?: No Does the patient have difficulty doing errands alone such as visiting a doctor's office or shopping?: No   Assessment & Plan  1. Diabetes mellitus type 2 in obese (HCC)  - POCT glycosylated hemoglobin (Hb A1C)  2. Need for influenza vaccination  - Flu Vaccine QUAD 6+ mos PF IM (Fluarix Quad PF)  3. Vitamin D deficiency  - VITAMIN D 25 Hydroxy (Vit-D Deficiency, Fractures)  4. Benign hypertension  - CBC with Differential/Platelet - COMPLETE METABOLIC PANEL WITH GFR  5. Elevated antinuclear antibody (ANA) level   6. Major depression in remission (HCC)  Off medication  7. Hypothyroidism, postablative  - TSH  8. Chronic constipation   9. Dyslipidemia  - Lipid panel  10. Iron deficiency anemia, unspecified iron deficiency anemia type  - CBC with Differential/Platelet - Iron, TIBC and Ferritin Panel  11. Thrombocytopathia (HCC)  -CBC  12. Gastro-esophageal reflux disease without esophagitis   13. Undifferentiated inflammatory arthritis (HCC)  - Sedimentation rate - C-reactive protein   14. Pharyngeal dysphagia  Discussed FMLA to be able to go to doctor's appointments.

## 2018-02-17 LAB — CBC WITH DIFFERENTIAL/PLATELET
BASOS ABS: 62 {cells}/uL (ref 0–200)
Basophils Relative: 0.9 %
EOS PCT: 1.7 %
Eosinophils Absolute: 117 cells/uL (ref 15–500)
HCT: 33.3 % — ABNORMAL LOW (ref 35.0–45.0)
Hemoglobin: 11.1 g/dL — ABNORMAL LOW (ref 11.7–15.5)
Lymphs Abs: 2284 cells/uL (ref 850–3900)
MCH: 26.7 pg — ABNORMAL LOW (ref 27.0–33.0)
MCHC: 33.3 g/dL (ref 32.0–36.0)
MCV: 80 fL (ref 80.0–100.0)
MONOS PCT: 6.2 %
MPV: 10.1 fL (ref 7.5–12.5)
NEUTROS PCT: 58.1 %
Neutro Abs: 4009 cells/uL (ref 1500–7800)
Platelets: 368 10*3/uL (ref 140–400)
RBC: 4.16 10*6/uL (ref 3.80–5.10)
RDW: 14 % (ref 11.0–15.0)
Total Lymphocyte: 33.1 %
WBC mixed population: 428 cells/uL (ref 200–950)
WBC: 6.9 10*3/uL (ref 3.8–10.8)

## 2018-02-17 LAB — IRON,TIBC AND FERRITIN PANEL
%SAT: 14 % (calc) — ABNORMAL LOW (ref 16–45)
Ferritin: 36 ng/mL (ref 16–232)
IRON: 52 ug/dL (ref 45–160)
TIBC: 384 ug/dL (ref 250–450)

## 2018-02-17 LAB — COMPLETE METABOLIC PANEL WITH GFR
AG RATIO: 1.3 (calc) (ref 1.0–2.5)
ALBUMIN MSPROF: 4.2 g/dL (ref 3.6–5.1)
ALKALINE PHOSPHATASE (APISO): 65 U/L (ref 33–130)
ALT: 11 U/L (ref 6–29)
AST: 16 U/L (ref 10–35)
BUN: 21 mg/dL (ref 7–25)
CALCIUM: 9.7 mg/dL (ref 8.6–10.4)
CO2: 30 mmol/L (ref 20–32)
CREATININE: 0.89 mg/dL (ref 0.50–1.05)
Chloride: 101 mmol/L (ref 98–110)
GFR, EST NON AFRICAN AMERICAN: 75 mL/min/{1.73_m2} (ref >=60)
GFR, Est African American: 87 mL/min/{1.73_m2} (ref >=60)
GLOBULIN: 3.2 g/dL (ref 1.9–3.7)
Glucose, Bld: 84 mg/dL (ref 65–139)
Potassium: 3.8 mmol/L (ref 3.5–5.3)
Sodium: 140 mmol/L (ref 135–146)
Total Bilirubin: 0.3 mg/dL (ref 0.2–1.2)
Total Protein: 7.4 g/dL (ref 6.1–8.1)

## 2018-02-17 LAB — TSH: TSH: 18.78 mIU/L — ABNORMAL HIGH

## 2018-02-17 LAB — LIPID PANEL
CHOL/HDL RATIO: 2.4 (calc) (ref <5.0)
CHOLESTEROL: 258 mg/dL — AB (ref <200)
HDL: 106 mg/dL (ref >50)
LDL CHOLESTEROL (CALC): 134 mg/dL — AB
NON-HDL CHOLESTEROL (CALC): 152 mg/dL — AB (ref <130)
TRIGLYCERIDES: 85 mg/dL (ref <150)

## 2018-02-17 LAB — C-REACTIVE PROTEIN: CRP: 20.3 mg/L — AB (ref <8.0)

## 2018-02-17 LAB — VITAMIN D 25 HYDROXY (VIT D DEFICIENCY, FRACTURES): VIT D 25 HYDROXY: 17 ng/mL — AB (ref 30–100)

## 2018-02-17 LAB — SEDIMENTATION RATE: Sed Rate: 55 mm/h — ABNORMAL HIGH (ref 0–30)

## 2018-02-18 ENCOUNTER — Other Ambulatory Visit: Payer: Self-pay | Admitting: Family Medicine

## 2018-02-18 ENCOUNTER — Encounter: Payer: Self-pay | Admitting: Family Medicine

## 2018-02-18 MED ORDER — ATORVASTATIN CALCIUM 10 MG PO TABS: 10.0000 mg | ORAL_TABLET | Freq: Every day | ORAL | 0 refills | Status: DC

## 2018-02-19 ENCOUNTER — Other Ambulatory Visit: Payer: Self-pay | Admitting: Family Medicine

## 2018-02-19 DIAGNOSIS — E890 Postprocedural hypothyroidism: Secondary | ICD-10-CM

## 2018-02-19 MED ORDER — LEVOTHYROXINE SODIUM 125 MCG PO TABS: 125.0000 ug | ORAL_TABLET | Freq: Every day | ORAL | 1 refills | Status: DC

## 2018-03-05 ENCOUNTER — Other Ambulatory Visit: Payer: Self-pay | Admitting: Family Medicine

## 2018-03-05 DIAGNOSIS — E890 Postprocedural hypothyroidism: Secondary | ICD-10-CM

## 2018-03-07 NOTE — Telephone Encounter (Signed)
Spoke with [facility] and they did receive this prescription and it is on hold to be filled 03/22/18.

## 2018-03-19 DIAGNOSIS — M1990 Unspecified osteoarthritis, unspecified site: Secondary | ICD-10-CM | POA: Diagnosis not present

## 2018-03-19 DIAGNOSIS — E559 Vitamin D deficiency, unspecified: Secondary | ICD-10-CM | POA: Diagnosis not present

## 2018-03-19 DIAGNOSIS — R768 Other specified abnormal immunological findings in serum: Secondary | ICD-10-CM | POA: Diagnosis not present

## 2018-03-19 DIAGNOSIS — Z79899 Other long term (current) drug therapy: Secondary | ICD-10-CM | POA: Diagnosis not present

## 2018-04-06 ENCOUNTER — Encounter: Payer: Self-pay | Admitting: Family Medicine

## 2018-04-09 ENCOUNTER — Other Ambulatory Visit: Payer: Self-pay | Admitting: Family Medicine

## 2018-04-09 DIAGNOSIS — I10 Essential (primary) hypertension: Secondary | ICD-10-CM

## 2018-04-09 DIAGNOSIS — F411 Generalized anxiety disorder: Secondary | ICD-10-CM

## 2018-04-09 MED ORDER — HYDROXYZINE HCL 10 MG PO TABS: 10.0000 mg | ORAL_TABLET | Freq: Two times a day (BID) | ORAL | 0 refills | Status: DC

## 2018-04-09 MED ORDER — OLMESARTAN MEDOXOMIL-HCTZ 40-25 MG PO TABS: 1.0000 | ORAL_TABLET | Freq: Every day | ORAL | 1 refills | Status: DC

## 2018-04-09 NOTE — Telephone Encounter (Signed)
This is not my patient I'll forward to her primary provider

## 2018-04-15 ENCOUNTER — Other Ambulatory Visit: Payer: Self-pay | Admitting: Family Medicine

## 2018-04-15 DIAGNOSIS — E890 Postprocedural hypothyroidism: Secondary | ICD-10-CM

## 2018-04-16 NOTE — Telephone Encounter (Signed)
lvm informing pt to come by to get lab done for refills. The lab will be open today from 8-12 and 2-4. Tomorrow the office will be closing at noon for the Christmas holiday

## 2018-04-17 DIAGNOSIS — E890 Postprocedural hypothyroidism: Secondary | ICD-10-CM | POA: Diagnosis not present

## 2018-04-17 LAB — TSH: TSH: 1 mIU/L

## 2018-04-19 ENCOUNTER — Other Ambulatory Visit: Payer: Self-pay | Admitting: Family Medicine

## 2018-04-19 DIAGNOSIS — E890 Postprocedural hypothyroidism: Secondary | ICD-10-CM

## 2018-04-19 MED ORDER — LEVOTHYROXINE SODIUM 125 MCG PO TABS: 125.0000 ug | ORAL_TABLET | Freq: Every day | ORAL | 1 refills | Status: DC

## 2018-05-06 ENCOUNTER — Other Ambulatory Visit: Payer: Self-pay | Admitting: Family Medicine

## 2018-05-06 DIAGNOSIS — F411 Generalized anxiety disorder: Secondary | ICD-10-CM

## 2018-06-01 ENCOUNTER — Other Ambulatory Visit: Payer: Self-pay | Admitting: Family Medicine

## 2018-06-01 DIAGNOSIS — I10 Essential (primary) hypertension: Secondary | ICD-10-CM

## 2018-06-01 NOTE — Telephone Encounter (Signed)
Hypertension medication request: Metoprolol   Last office visit pertaining to hypertension: 02/16/2018   BP Readings from Last 3 Encounters:  02/16/18 134/84  10/12/17 (!) 130/96  08/09/17 (!) 140/100    Lab Results  Component Value Date   CREATININE 0.89 02/16/2018   BUN 21 02/16/2018   NA 140 02/16/2018   K 3.8 02/16/2018   CL 101 02/16/2018   CO2 30 02/16/2018     Follow up on 06/19/2018

## 2018-06-15 ENCOUNTER — Other Ambulatory Visit: Payer: Self-pay | Admitting: Family Medicine

## 2018-06-15 NOTE — Telephone Encounter (Signed)
Refill Request for Cholesterol medication. Atorvastatin 10 mg  Last physical: None indicated  Lab Results  Component Value Date   CHOL 258 (H) 02/16/2018   HDL 106 02/16/2018   LDLCALC 134 (H) 02/16/2018   TRIG 85 02/16/2018   CHOLHDL 2.4 02/16/2018    Follow up: 06/19/2018

## 2018-06-19 ENCOUNTER — Encounter: Payer: Self-pay | Admitting: Family Medicine

## 2018-06-19 ENCOUNTER — Ambulatory Visit (INDEPENDENT_AMBULATORY_CARE_PROVIDER_SITE_OTHER): Payer: BLUE CROSS/BLUE SHIELD | Admitting: Family Medicine

## 2018-06-19 VITALS — BP 166/120 | HR 74 | Temp 98.0°F | Resp 16 | Ht 64.0 in | Wt 203.8 lb

## 2018-06-19 DIAGNOSIS — I10 Essential (primary) hypertension: Secondary | ICD-10-CM | POA: Diagnosis not present

## 2018-06-19 DIAGNOSIS — E1169 Type 2 diabetes mellitus with other specified complication: Secondary | ICD-10-CM

## 2018-06-19 DIAGNOSIS — E559 Vitamin D deficiency, unspecified: Secondary | ICD-10-CM

## 2018-06-19 DIAGNOSIS — J302 Other seasonal allergic rhinitis: Secondary | ICD-10-CM

## 2018-06-19 DIAGNOSIS — E669 Obesity, unspecified: Secondary | ICD-10-CM | POA: Diagnosis not present

## 2018-06-19 DIAGNOSIS — E890 Postprocedural hypothyroidism: Secondary | ICD-10-CM | POA: Diagnosis not present

## 2018-06-19 DIAGNOSIS — M064 Inflammatory polyarthropathy: Secondary | ICD-10-CM

## 2018-06-19 DIAGNOSIS — E785 Hyperlipidemia, unspecified: Secondary | ICD-10-CM

## 2018-06-19 DIAGNOSIS — K5909 Other constipation: Secondary | ICD-10-CM

## 2018-06-19 DIAGNOSIS — F320 Major depressive disorder, single episode, mild: Secondary | ICD-10-CM

## 2018-06-19 DIAGNOSIS — J3089 Other allergic rhinitis: Secondary | ICD-10-CM

## 2018-06-19 DIAGNOSIS — D691 Qualitative platelet defects: Secondary | ICD-10-CM

## 2018-06-19 LAB — POCT GLYCOSYLATED HEMOGLOBIN (HGB A1C): HbA1c, POC (controlled diabetic range): 6.7 % (ref 0.0–7.0)

## 2018-06-19 MED ORDER — VITAMIN D (ERGOCALCIFEROL) 1.25 MG (50000 UNIT) PO CAPS: 50000.0000 [IU] | ORAL_CAPSULE | ORAL | 0 refills | Status: DC

## 2018-06-19 MED ORDER — LORATADINE 10 MG PO TBDP: 10.0000 mg | ORAL_TABLET | Freq: Every day | ORAL | 2 refills | Status: DC

## 2018-06-19 MED ORDER — LINACLOTIDE 145 MCG PO CAPS: 145.0000 ug | ORAL_CAPSULE | Freq: Every day | ORAL | 0 refills | Status: DC

## 2018-06-19 NOTE — Progress Notes (Signed)
Name: Shelia Vasquez   MRN: [id number]    DOB: 07-08-1965   Date:06/19/2018       Progress Note  Subjective  Chief Complaint  Chief Complaint  Patient presents with  . Medication Refill  . Diabetes  . Hypertension    Denies any symptoms  . Depression  . Hypothyroidism  . Dyslipidemia  . Gastroesophageal Reflux    Stopped Ranitidine due to recall-takes tums as needed    HPI   DMII:diagnosed back in Dec 2018 with hgb of6.6%. She is also obese and has family history of DM, she is on life style modification only,eye exam is up to date. She denies polyphagia, polydipsia or polyuria at this time.We will check urine micro today  She has gained weight since last visit and states not sure how.  On life style modification only. HgbA1C is 6.7%  HTN: not on norvasc because of edema,she is on Benicar/HCTZ and metoprolol and bp today was at goal on her last visit, but was up on she arrived, she took meds when she arrived to our office.   She states bp at home has been mostly around 130's/80's, occasionally goes up to 150's /90's not higher than that.  Denies headaches, chest pain, palpitation  or dizziness  Major Depression Mild : She has a long history of depression - since her teenage years. Symptoms got worse in 24-Aug-2015 when her father died . She has a 34 yo daughter with  autism and does not like getting out of the house, she feels trapped at times. She tried Duloxetine in the past but stopped on her own. She states she is doing okay, refuses medication or therapy at this time   Inflammatory arthritis: seen by Rheumatologistnow on Plaquenil, tolerating medication well. Eye exam is up to date and we will obtain a copy. Off prednisone. She states pain is mild- moderate, on hands and her lower back, 8/10 , described as aching. No swelling at this time or redness. She thinks worse because of the weather   Hypothyroidism: she gained 9 lbs since last visit.   constipation still present,  states Linzess too expensive but we gave her a voucher today.  Recheck TSH   GERD: taking Ranitidine prn, states occasionally has dysphagia, food gets stuck on upper esophagus, discussed referral to GI  She refuses to go at this time, she states it is not very often    Patient Active Problem List   Diagnosis Date Noted  . Undifferentiated inflammatory arthritis (HCC) 08-23-17  . Positive ANA (antinuclear antibody) 07/26/2017  . Diet-controlled type 2 diabetes mellitus (HCC) 07/12/2017  . Chronic midline low back pain without sciatica 08/24/2016  . Sedimentation rate elevation 07/18/2016  . Elevated C-reactive protein (CRP) 12/09/2015  . Hyperglycemia 12/09/2015  . Benign hypertension 02/06/2015  . Dyslipidemia 02/06/2015  . Insomnia 02/06/2015  . Gastro-esophageal reflux disease without esophagitis 02/06/2015  . History of radioactive iodine thyroid ablation 02/06/2015  . Hypothyroidism, postablative 02/06/2015  . History of iron deficiency anemia 02/06/2015  . Obesity (BMI 30.0-34.9) 02/06/2015  . H/O: hysterectomy 02/06/2015  . Vitamin D deficiency 02/06/2015  . Seasonal allergic rhinitis 02/06/2015  . Chronic constipation 02/06/2015  . History of uterine fibroid 05/29/2013    Past Surgical History:  Procedure Laterality Date  . ABDOMINAL HYSTERECTOMY    . BTL    . CESAREAN SECTION    . COLONOSCOPY WITH PROPOFOL N/A 03/07/2017   Procedure: COLONOSCOPY WITH PROPOFOL;  Surgeon: Midge Minium, MD;  Location:  [facility] ENDOSCOPY;  Service: Endoscopy;  Laterality: N/A;  . TUBAL LIGATION      Family History  Problem Relation Age of Onset  . Arthritis Father   . Autism Daughter   . Breast cancer Neg Hx     Social History   Socioeconomic History  . Marital status: Married    Spouse name: Jomarie Longs   . Number of children: 1  . Years of education: Not on file  . Highest education level: Some college, no degree  Occupational History  . Not on file  Social Needs  . Financial  resource strain: Not hard at all  . Food insecurity:    Worry: Never true    Inability: Never true  . Transportation needs:    Medical: No    Non-medical: No  Tobacco Use  . Smoking status: Never Smoker  . Smokeless tobacco: Never Used  Substance and Sexual Activity  . Alcohol use: No    Alcohol/week: 0.0 standard drinks  . Drug use: No  . Sexual activity: Yes    Partners: Male  Lifestyle  . Physical activity:    Days per week: 0 days    Minutes per session: 0 min  . Stress: Rather much  Relationships  . Social connections:    Talks on phone: Three times a week    Gets together: Three times a week    Attends religious service: More than 4 times per year    Active member of club or organization: No    Attends meetings of clubs or organizations: Never    Relationship status: Married  . Intimate partner violence:    Fear of current or ex partner: No    Emotionally abused: No    Physically abused: No    Forced sexual activity: No  Other Topics Concern  . Not on file  Social History Narrative   She is married   Husband has a son   They have a daughter together, she was diagnosed with autism at age 55     Current Outpatient Medications:  .  aspirin EC 81 MG tablet, Take 1 tablet by mouth daily., Disp: , Rfl:  .  atorvastatin (LIPITOR) 10 MG tablet, TAKE 1 TABLET BY MOUTH EVERY DAY, Disp: 90 tablet, Rfl: 1 .  cholecalciferol (VITAMIN D3) 25 MCG (1000 UT) tablet, Take 1,000 Units by mouth daily., Disp: , Rfl:  .  ferrous sulfate 325 (65 FE) MG tablet, Take 325 mg by mouth daily., Disp: , Rfl:  .  fluticasone (FLONASE) 50 MCG/ACT nasal spray, Place 2 sprays into both nostrils daily., Disp: 16 g, Rfl: 6 .  hydroxychloroquine (PLAQUENIL) 200 MG tablet, Take 1 tablet by mouth 2 (two) times daily., Disp: , Rfl:  .  hydrOXYzine (ATARAX/VISTARIL) 10 MG tablet, TAKE 1 TABLET BY MOUTH TWICE A DAY, Disp: 180 tablet, Rfl: 1 .  ibuprofen (ADVIL,MOTRIN) 600 MG tablet, Take by mouth.,  Disp: , Rfl:  .  levothyroxine (SYNTHROID, LEVOTHROID) 125 MCG tablet, Take 1 tablet (125 mcg total) by mouth daily., Disp: 30 tablet, Rfl: 1 .  metoprolol succinate (TOPROL-XL) 25 MG 24 hr tablet, TAKE 1 TABLET BY MOUTH EVERY DAY, Disp: 90 tablet, Rfl: 0 .  olmesartan-hydrochlorothiazide (BENICAR HCT) 40-25 MG tablet, Take 1 tablet by mouth daily., Disp: 90 tablet, Rfl: 1 .  linaclotide (LINZESS) 145 MCG CAPS capsule, Take 1 capsule (145 mcg total) by mouth daily., Disp: 90 capsule, Rfl: 0 .  loratadine (CLARITIN REDITABS) 10 MG dissolvable tablet, Take  1 tablet (10 mg total) by mouth daily., Disp: 30 tablet, Rfl: 2 .  Vitamin D, Ergocalciferol, (DRISDOL) 1.25 MG (50000 UT) CAPS capsule, Take 1 capsule (50,000 Units total) by mouth every 7 (seven) days., Disp: 12 capsule, Rfl: 0  No Known Allergies  I personally reviewed active problem list, medication list, allergies, family history, social history with the patient/caregiver today.   ROS  Constitutional: Negative for fever or weight change.  Respiratory: Negative for cough and shortness of breath.   Cardiovascular: Negative for chest pain or palpitations.  Gastrointestinal: Negative for abdominal pain, no bowel changes.  Musculoskeletal: Negative for gait problem or joint swelling.  Skin: Negative for rash.  Neurological: Negative for dizziness or headache.  No other specific complaints in a complete review of systems (except as listed in HPI above).   Objective  Vitals:   06/19/18 0921 06/19/18 0923  BP: (!) 152/92 (!) 160/110  Pulse: 74   Resp: 16   Temp: 98 F (36.7 C)   TempSrc: Oral   SpO2: 96%   Weight: 203 lb 12.8 oz (92.4 kg)   Height:  (1.626 m)     Body mass index is 34.98 kg/m.  Physical Exam  Constitutional: Patient appears well-developed and well-nourished. Obese No distress.  HEENT: head atraumatic, normocephalic, pupils equal and reactive to light,  neck supple, throat within normal  limits Cardiovascular: Normal rate, regular rhythm and normal heart sounds.  No murmur heard. No BLE edema. Pulmonary/Chest: Effort normal and breath sounds normal. No respiratory distress. Abdominal: Soft.  There is no tenderness. Muscular Skeletal: no synovitis noticed on wrists today  Psychiatric: Patient has a normal mood and affect. behavior is normal. Judgment and thought content normal.  Recent Results (from the past 2160 hour(s))  TSH     Status: None   Collection Time: 04/17/18  9:21 AM  Result Value Ref Range   TSH 1.00 mIU/L    Comment:           Reference Range .           > or = 20 Years  0.40-4.50 .                Pregnancy Ranges           First trimester    0.26-2.66           Second trimester   0.55-2.73           Third trimester    0.43-2.91   POCT HgB A1C     Status: Normal   Collection Time: 06/19/18  9:20 AM  Result Value Ref Range   Hemoglobin A1C     HbA1c POC (<> result, manual entry)     HbA1c, POC (prediabetic range)     HbA1c, POC (controlled diabetic range) 6.7 0.0 - 7.0 %    Diabetic Foot Exam: Diabetic Foot Exam - Simple   Simple Foot Form Diabetic Foot exam was performed with the following findings:  Yes 06/19/2018  9:49 AM  Visual Inspection See comments:  Yes Sensation Testing Intact to touch and monofilament testing bilaterally:  Yes Pulse Check Posterior Tibialis and Dorsalis pulse intact bilaterally:  Yes Comments Some callus formation on first toes      PHQ2/9: Depression screen [facility] 2/9 06/19/2018 02/16/2018 10/12/2017 07/12/2017 12/08/2015  Decreased Interest 1 0 2 0 0  Down, Depressed, Hopeless 0 0 2 2 -  PHQ - 2 Score 1 0 4 2 0  Altered sleeping  -  Tired, decreased energy -  Change in appetite 3 0 2 2 -  Feeling bad or failure about yourself  0 0 0 0 -  Trouble concentrating 0 0 1 0 -  Moving slowly or fidgety/restless 0 0 0 0 -  Suicidal thoughts 0 0 0 0 -  PHQ-9 Score -  Difficult doing  work/chores Not difficult at all Not difficult at all Not difficult at all Somewhat difficult -     Fall Risk: Fall Risk  06/19/2018 02/16/2018 10/12/2017 07/12/2017 04/13/2017  Falls in the past year? 0 No No No No     Functional Status Survey: Is the patient deaf or have difficulty hearing?: No Does the patient have difficulty seeing, even when wearing glasses/contacts?: Yes Does the patient have difficulty concentrating, remembering, or making decisions?: No Does the patient have difficulty walking or climbing stairs?: No Does the patient have difficulty dressing or bathing?: No Does the patient have difficulty doing errands alone such as visiting a doctor's office or shopping?: No    Assessment & Plan  1. Diabetes mellitus type 2 in obese (HCC)  - Urine Microalbumin w/creat. ratio - POCT HgB A1C  2. Vitamin D deficiency  - Vitamin D, Ergocalciferol, (DRISDOL) 1.25 MG (50000 UT) CAPS capsule; Take 1 capsule (50,000 Units total) by mouth every 7 (seven) days.  Dispense: 12 capsule; Refill: 0  3. Hypothyroidism, postablative  - TSH  4. Benign hypertension  - COMPLETE METABOLIC PANEL WITH GFR  5. Thrombocytopathia (HCC)  - CBC with Differential/Platelet  6. Mild major depression (HCC)  She refuses medication   7. Undifferentiated inflammatory arthritis (HCC)  Under the care of Dr. Renard Matter  - CBC with Differential/Platelet  8. Dyslipidemia  - Lipid panel  9. Perennial allergic rhinitis with seasonal variation  - loratadine (CLARITIN REDITABS) 10 MG dissolvable tablet; Take 1 tablet (10 mg total) by mouth daily.  Dispense: 30 tablet; Refill: 2

## 2018-06-20 LAB — COMPLETE METABOLIC PANEL WITH GFR
AG RATIO: 1.3 (calc) (ref 1.0–2.5)
ALT: 10 U/L (ref 6–29)
AST: 15 U/L (ref 10–35)
Albumin: 3.9 g/dL (ref 3.6–5.1)
Alkaline phosphatase (APISO): 67 U/L (ref 37–153)
BUN: 19 mg/dL (ref 7–25)
CO2: 29 mmol/L (ref 20–32)
Calcium: 9.4 mg/dL (ref 8.6–10.4)
Chloride: 103 mmol/L (ref 98–110)
Creat: 0.77 mg/dL (ref 0.50–1.05)
GFR, Est African American: 103 mL/min/{1.73_m2} (ref >=60)
GFR, Est Non African American: 89 mL/min/{1.73_m2} (ref >=60)
Globulin: 3.1 g/dL (calc) (ref 1.9–3.7)
Glucose, Bld: 88 mg/dL (ref 65–99)
Potassium: 3.8 mmol/L (ref 3.5–5.3)
Sodium: 141 mmol/L (ref 135–146)
Total Bilirubin: 0.3 mg/dL (ref 0.2–1.2)
Total Protein: 7 g/dL (ref 6.1–8.1)

## 2018-06-20 LAB — LIPID PANEL
Cholesterol: 172 mg/dL (ref <200)
HDL: 94 mg/dL (ref >=50)
LDL Cholesterol (Calc): 63 mg/dL (calc)
Non-HDL Cholesterol (Calc): 78 mg/dL (calc) (ref <130)
Total CHOL/HDL Ratio: 1.8 (calc) (ref <5.0)
Triglycerides: 70 mg/dL (ref <150)

## 2018-06-20 LAB — CBC WITH DIFFERENTIAL/PLATELET
Absolute Monocytes: 511 cells/uL (ref 200–950)
BASOS ABS: 42 {cells}/uL (ref 0–200)
Basophils Relative: 0.6 %
EOS PCT: 2.3 %
Eosinophils Absolute: 161 cells/uL (ref 15–500)
HCT: 31.4 % — ABNORMAL LOW (ref 35.0–45.0)
Hemoglobin: 10.2 g/dL — ABNORMAL LOW (ref 11.7–15.5)
Lymphs Abs: 2359 cells/uL (ref 850–3900)
MCH: 25.8 pg — ABNORMAL LOW (ref 27.0–33.0)
MCHC: 32.5 g/dL (ref 32.0–36.0)
MCV: 79.5 fL — ABNORMAL LOW (ref 80.0–100.0)
MPV: 10 fL (ref 7.5–12.5)
Monocytes Relative: 7.3 %
Neutro Abs: 3927 cells/uL (ref 1500–7800)
Neutrophils Relative %: 56.1 %
Platelets: 360 10*3/uL (ref 140–400)
RBC: 3.95 10*6/uL (ref 3.80–5.10)
RDW: 14.7 % (ref 11.0–15.0)
Total Lymphocyte: 33.7 %
WBC: 7 10*3/uL (ref 3.8–10.8)

## 2018-06-20 LAB — TSH: TSH: 4.19 mIU/L

## 2018-06-20 LAB — MICROALBUMIN / CREATININE URINE RATIO
Creatinine, Urine: 250 mg/dL (ref 20–275)
MICROALB UR: 2.6 mg/dL
Microalb Creat Ratio: 10 mcg/mg creat (ref <30)

## 2018-07-01 ENCOUNTER — Other Ambulatory Visit: Payer: Self-pay | Admitting: Family Medicine

## 2018-07-01 DIAGNOSIS — E890 Postprocedural hypothyroidism: Secondary | ICD-10-CM

## 2018-08-23 ENCOUNTER — Other Ambulatory Visit: Payer: Self-pay | Admitting: Family Medicine

## 2018-08-23 DIAGNOSIS — I10 Essential (primary) hypertension: Secondary | ICD-10-CM

## 2018-08-24 NOTE — Telephone Encounter (Signed)
Refill request for Hypertension medication:  Metoprolol 25 mg  Last office visit pertaining to hypertension: 06/19/2018  BP Readings from Last 3 Encounters:  06/19/18 (!) 166/120  02/16/18 134/84  10/12/17 (!) 130/96    Lab Results  Component Value Date   CREATININE 0.77 06/19/2018   BUN 19 06/19/2018   NA 141 06/19/2018   K 3.8 06/19/2018   CL 103 06/19/2018   CO2 29 06/19/2018   Follow-ups on file. 09/20/2018

## 2018-09-13 ENCOUNTER — Other Ambulatory Visit: Payer: Self-pay | Admitting: Family Medicine

## 2018-09-13 ENCOUNTER — Encounter: Payer: Self-pay | Admitting: Family Medicine

## 2018-09-13 DIAGNOSIS — E890 Postprocedural hypothyroidism: Secondary | ICD-10-CM

## 2018-09-13 DIAGNOSIS — I10 Essential (primary) hypertension: Secondary | ICD-10-CM

## 2018-09-14 ENCOUNTER — Other Ambulatory Visit: Payer: Self-pay | Admitting: Family Medicine

## 2018-09-14 DIAGNOSIS — I10 Essential (primary) hypertension: Secondary | ICD-10-CM

## 2018-09-14 MED ORDER — OLMESARTAN MEDOXOMIL-HCTZ 40-25 MG PO TABS: 1.0000 | ORAL_TABLET | Freq: Every day | ORAL | 0 refills | Status: DC

## 2018-09-18 ENCOUNTER — Encounter: Payer: Self-pay | Admitting: Family Medicine

## 2018-09-20 ENCOUNTER — Ambulatory Visit: Payer: BLUE CROSS/BLUE SHIELD | Admitting: Family Medicine

## 2018-09-28 ENCOUNTER — Encounter: Payer: Self-pay | Admitting: Family Medicine

## 2018-09-28 ENCOUNTER — Ambulatory Visit (INDEPENDENT_AMBULATORY_CARE_PROVIDER_SITE_OTHER): Payer: BLUE CROSS/BLUE SHIELD | Admitting: Family Medicine

## 2018-09-28 VITALS — BP 140/95 | HR 80 | Ht 64.0 in

## 2018-09-28 DIAGNOSIS — E890 Postprocedural hypothyroidism: Secondary | ICD-10-CM | POA: Diagnosis not present

## 2018-09-28 DIAGNOSIS — M130 Polyarthritis, unspecified: Secondary | ICD-10-CM

## 2018-09-28 DIAGNOSIS — E785 Hyperlipidemia, unspecified: Secondary | ICD-10-CM | POA: Diagnosis not present

## 2018-09-28 DIAGNOSIS — I10 Essential (primary) hypertension: Secondary | ICD-10-CM

## 2018-09-28 DIAGNOSIS — F320 Major depressive disorder, single episode, mild: Secondary | ICD-10-CM | POA: Diagnosis not present

## 2018-09-28 DIAGNOSIS — D638 Anemia in other chronic diseases classified elsewhere: Secondary | ICD-10-CM

## 2018-09-28 MED ORDER — OLMESARTAN MEDOXOMIL-HCTZ 40-25 MG PO TABS: 1.0000 | ORAL_TABLET | Freq: Every day | ORAL | 0 refills | Status: DC

## 2018-09-28 MED ORDER — METOPROLOL SUCCINATE ER 25 MG PO TB24: 25.0000 mg | ORAL_TABLET | Freq: Every day | ORAL | 0 refills | Status: DC

## 2018-09-28 MED ORDER — LEVOTHYROXINE SODIUM 125 MCG PO TABS: 125.0000 ug | ORAL_TABLET | Freq: Every day | ORAL | 0 refills | Status: DC

## 2018-09-28 NOTE — Progress Notes (Signed)
Name: Shelia Vasquez   MRN: [id number]    DOB: May 11, 1965   Date:09/28/2018       Progress Note  Subjective  Chief Complaint  Chief Complaint  Patient presents with   Medication Refill   Diabetes   Hypertension    Denies any symptoms   Depression   Hypothyroidism    Dry skin   Dyslipidemia   Gastroesophageal Reflux    I connected with  Shelia Vasquez  on 09/28/18 at 10:00 AM EDT by a telephone encounter, verified that I am speaking with the correct person using two identifiers.  I discussed the limitations of evaluation and management by telemedicine and the availability of in person appointments. The patient expressed understanding and agreed to proceed. Staff also discussed with the patient that there may be a patient responsible charge related to this service. Patient Location: at home  Provider Location:    HPI  DMII:diagnosed back in Dec 2018 with hgb of6.6%. She is also obese and has family history of DM, she is on life style modification only,eye exam is up to date. She denies polyphagia, polydipsia or polyuria at this time. Last urine micro was normal on 05/2018 she is not taking medication and A1C back in Feb was 6.7%, she has decreased carbohydrate intake. She states she will start checking glucose at home   HTN: not on norvasc because of edema,she is on Benicar/HCTZ andmetoprololand bp today was at goal on her last visit, she states at home usually 120's130's-80's. She states it was high at home today because she was rushing and did not take bp medication before she checked. Asked her to check it now, but has not taken it yet. Explained importance of taking medication daily at the same time. No chest pain or palpitation.   Major DepressionMild : She has a long history of depression - since her teenage years. Symptoms got worse in 2017 when her father died . She has a 52 yo daughter withautism and does not like getting out of the house, she feels  trapped at times. She tried Duloxetine in the past but stopped on her own. She states she is doing okay, refuses medication or therapy at this time She is working from home and is having less lack of energy since COVID-19   Inflammatory arthritis: seen by Rheumatologistnow on Plaquenil, tolerating medication well. Eye exam is up to date and we will obtain a copy. Off prednisone. She statespain is mild- moderate, on hands and her lower back, 2/10 , described as aching, pain goes up and down and can go in different area, wrist, back or ankles. No swelling at this time or redness.   Hypothyroidism:she states her scale broke not sure if she gained weight since last visit. No change in bowel movements . Skin is always dry, last TSH was normal   GERD: taking Ranitidine prn, states occasionally has dysphagia, food gets stuck on upper esophagus, discussed referral to GI  She refuses to go at this time, she states it is not very often . Unchanged    Patient Active Problem List   Diagnosis Date Noted   Undifferentiated inflammatory arthritis (HCC) 08/09/2017   Positive ANA (antinuclear antibody) 07/26/2017   Diet-controlled type 2 diabetes mellitus (HCC) 07/12/2017   Chronic midline low back pain without sciatica 08/24/2016   Sedimentation rate elevation 07/18/2016   Elevated C-reactive protein (CRP) 12/09/2015   Hyperglycemia 12/09/2015   Benign hypertension 02/06/2015   Dyslipidemia 02/06/2015   Insomnia  02/06/2015   Gastro-esophageal reflux disease without esophagitis 02/06/2015   History of radioactive iodine thyroid ablation 02/06/2015   Hypothyroidism, postablative 02/06/2015   History of iron deficiency anemia 02/06/2015   Obesity (BMI 30.0-34.9) 02/06/2015   H/O: hysterectomy 02/06/2015   Vitamin D deficiency 02/06/2015   Seasonal allergic rhinitis 02/06/2015   Chronic constipation 02/06/2015   History of uterine fibroid 05/29/2013    Past Surgical  History:  Procedure Laterality Date   ABDOMINAL HYSTERECTOMY     BTL     CESAREAN SECTION     COLONOSCOPY WITH PROPOFOL N/A 03/07/2017   Procedure: COLONOSCOPY WITH PROPOFOL;  Surgeon: Midge Minium, MD;  Location: [facility] ENDOSCOPY;  Service: Endoscopy;  Laterality: N/A;   TUBAL LIGATION      Family History  Problem Relation Age of Onset   Arthritis Father    Autism Daughter    Breast cancer Neg Hx     Social History   Socioeconomic History   Marital status: Married    Spouse name: Jomarie Longs    Number of children: 1   Years of education: Not on file   Highest education level: Some college, no degree  Occupational History   Not on file  Social Needs   Financial resource strain: Not hard at all   Food insecurity:    Worry: Never true    Inability: Never true   Transportation needs:    Medical: No    Non-medical: No  Tobacco Use   Smoking status: Never Smoker   Smokeless tobacco: Never Used  Substance and Sexual Activity   Alcohol use: No    Alcohol/week: 0.0 standard drinks   Drug use: No   Sexual activity: Yes    Partners: Male  Lifestyle   Physical activity:    Days per week: 0 days    Minutes per session: 0 min   Stress: Rather much  Relationships   Social connections:    Talks on phone: Three times a week    Gets together: Three times a week    Attends religious service: More than 4 times per year    Active member of club or organization: No    Attends meetings of clubs or organizations: Never    Relationship status: Married   Intimate partner violence:    Fear of current or ex partner: No    Emotionally abused: No    Physically abused: No    Forced sexual activity: No  Other Topics Concern   Not on file  Social History Narrative   She is married   Husband has a son   They have a daughter together, she was diagnosed with autism at age 28     Current Outpatient Medications:    aspirin EC 81 MG tablet, Take 1 tablet by mouth  daily., Disp: , Rfl:    atorvastatin (LIPITOR) 10 MG tablet, TAKE 1 TABLET BY MOUTH EVERY DAY, Disp: 90 tablet, Rfl: 1   cholecalciferol (VITAMIN D3) 25 MCG (1000 UT) tablet, Take 1,000 Units by mouth daily., Disp: , Rfl:    ferrous sulfate 325 (65 FE) MG tablet, Take 325 mg by mouth daily., Disp: , Rfl:    fluticasone (FLONASE) 50 MCG/ACT nasal spray, Place 2 sprays into both nostrils daily., Disp: 16 g, Rfl: 6   hydroxychloroquine (PLAQUENIL) 200 MG tablet, Take 1 tablet by mouth 2 (two) times daily., Disp: , Rfl:    levothyroxine (SYNTHROID, LEVOTHROID) 125 MCG tablet, TAKE 1 TABLET BY MOUTH EVERY DAY, Disp:  90 tablet, Rfl: 0   linaclotide (LINZESS) 145 MCG CAPS capsule, Take 1 capsule (145 mcg total) by mouth daily., Disp: 90 capsule, Rfl: 0   loratadine (CLARITIN REDITABS) 10 MG dissolvable tablet, Take 1 tablet (10 mg total) by mouth daily., Disp: 30 tablet, Rfl: 2   metoprolol succinate (TOPROL-XL) 25 MG 24 hr tablet, TAKE 1 TABLET BY MOUTH EVERY DAY, Disp: 90 tablet, Rfl: 0   olmesartan-hydrochlorothiazide (BENICAR HCT) 40-25 MG tablet, Take 1 tablet by mouth daily., Disp: 30 tablet, Rfl: 0   Vitamin D, Ergocalciferol, (DRISDOL) 1.25 MG (50000 UT) CAPS capsule, Take 1 capsule (50,000 Units total) by mouth every 7 (seven) days., Disp: 12 capsule, Rfl: 0   hydrOXYzine (ATARAX/VISTARIL) 10 MG tablet, TAKE 1 TABLET BY MOUTH TWICE A DAY (Patient not taking: Reported on 09/28/2018), Disp: 180 tablet, Rfl: 1   ibuprofen (ADVIL,MOTRIN) 600 MG tablet, Take by mouth., Disp: , Rfl:   No Known Allergies  I personally reviewed active problem list, medication list, allergies, family history, social history with the patient/caregiver today.   ROS  Ten systems reviewed and is negative except as mentioned in HPI   Objective  Virtual encounter, vitals obtained at home   Body mass index is 34.98 kg/m.  Physical Exam  Awake, alert and oriented   PHQ2/9: Depression screen [facility] 2/9  09/28/2018 06/19/2018 02/16/2018 10/12/2017 07/12/2017  Decreased Interest 2 1 0 2 0  Down, Depressed, Hopeless 0 0 0 2 2  PHQ - 2 Score 2 1 0 4 2  Altered sleeping 0 1 1 2 2   Tired, decreased energy 0 1 3 2 2   Change in appetite 0 3 0 2 2  Feeling bad or failure about yourself  0 0 0 0 0  Trouble concentrating 0 0 0 1 0  Moving slowly or fidgety/restless 0 0 0 0 0  Suicidal thoughts 0 0 0 0 0  PHQ-9 Score 2 6 4 11 8   Difficult doing work/chores Not difficult at all Not difficult at all Not difficult at all Not difficult at all Somewhat difficult   PHQ-2/9 Result is positive.    Fall Risk: Fall Risk  09/28/2018 06/19/2018 02/16/2018 10/12/2017 07/12/2017  Falls in the past year? 0 0 No No No  Number falls in past yr: 0 - - - -  Injury with Fall? 0 - - - -     Assessment & Plan   1. Hypothyroidism, postablative  - levothyroxine (SYNTHROID) 125 MCG tablet; Take 1 tablet (125 mcg total) by mouth daily.  Dispense: 90 tablet; Refill: 0  2. Benign hypertension  - metoprolol succinate (TOPROL-XL) 25 MG 24 hr tablet; Take 1 tablet (25 mg total) by mouth daily.  Dispense: 90 tablet; Refill: 0 - olmesartan-hydrochlorothiazide (BENICAR HCT) 40-25 MG tablet; Take 1 tablet by mouth daily.  Dispense: 90 tablet; Refill: 0  3. Dyslipidemia   4. Mild major depression (HCC)  Refuses SSRI  5. Chronic polyarthritis  Keep follow up with rheumatologist   6. Anemia of chronic disease  Recheck next visit  I discussed the assessment and treatment plan with the patient. The patient was provided an opportunity to ask questions and all were answered. The patient agreed with the plan and demonstrated an understanding of the instructions.  The patient was advised to call back or seek an in-person evaluation if the symptoms worsen or if the condition fails to improve as anticipated.  I provided 25 minutes of non-face-to-face time during this encounter.

## 2018-12-06 IMAGING — MG MM DIGITAL SCREENING BILAT W/ TOMO W/ CAD
9 of 12 series · 9 of 28 positions shown · non-contrast
Comparison: Previous exam(s).

CLINICAL DATA: Screening.

EXAM:
2D DIGITAL SCREENING BILATERAL MAMMOGRAM WITH CAD AND ADJUNCT TOMO

[L CC]
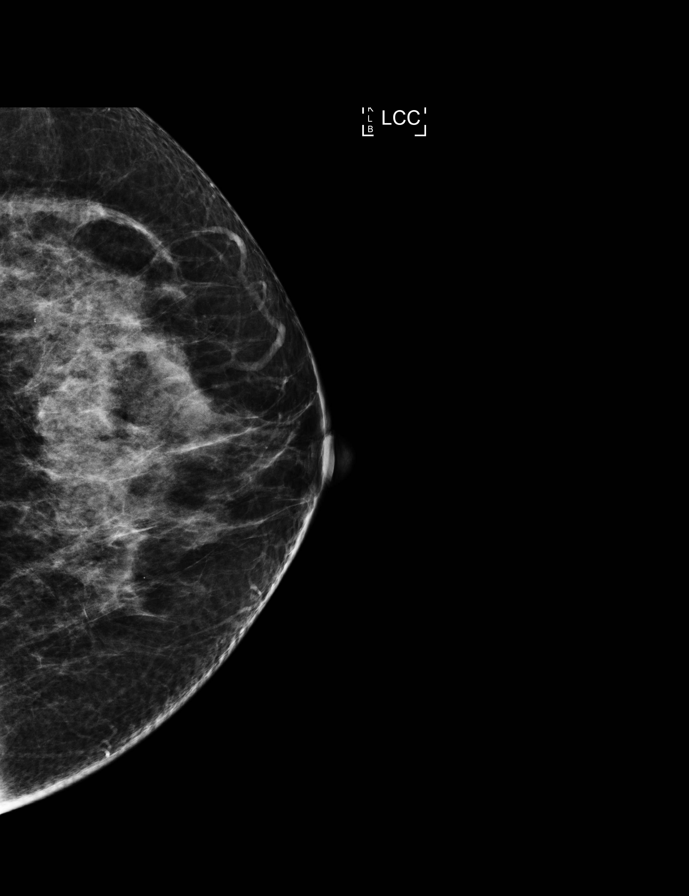

[L MLO synth-2D]
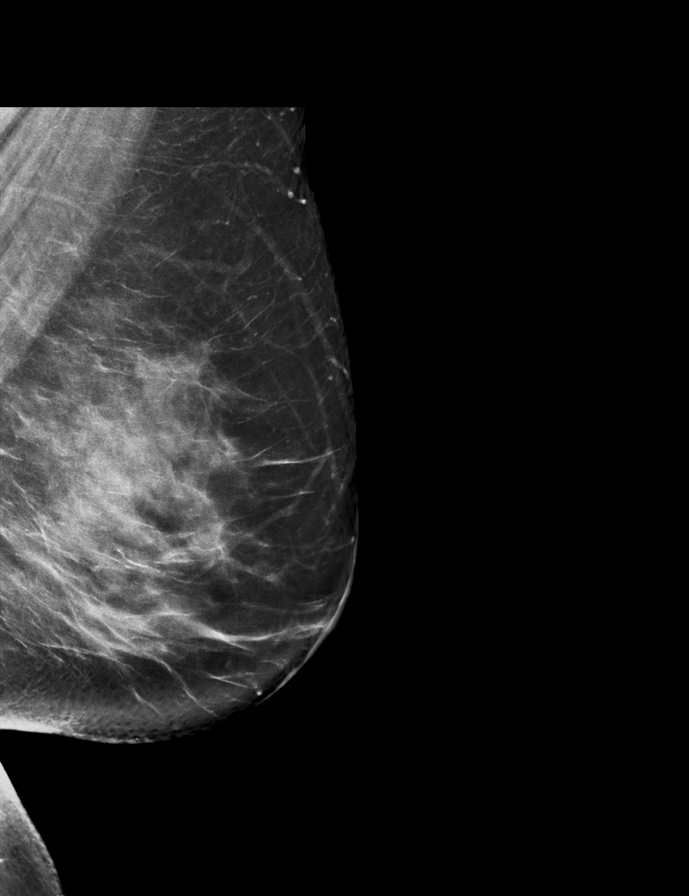

[R CC]
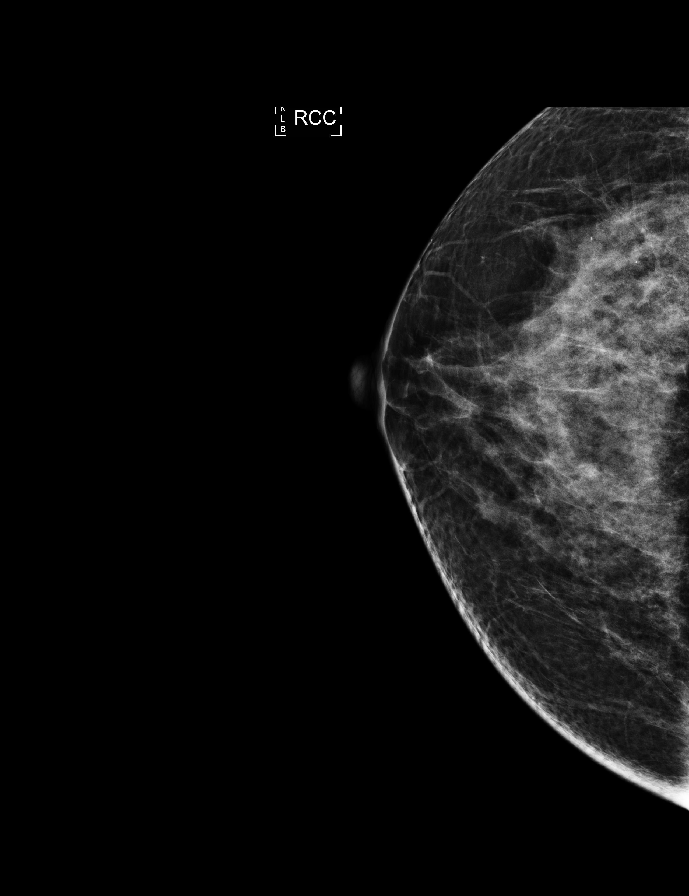

[R CC synth-2D]
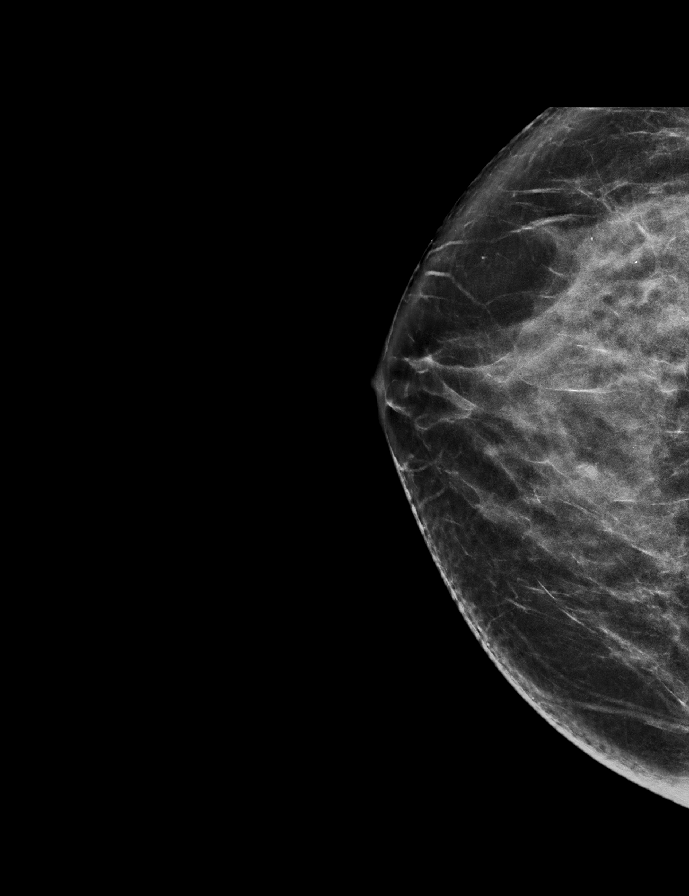

[L CC synth-2D]
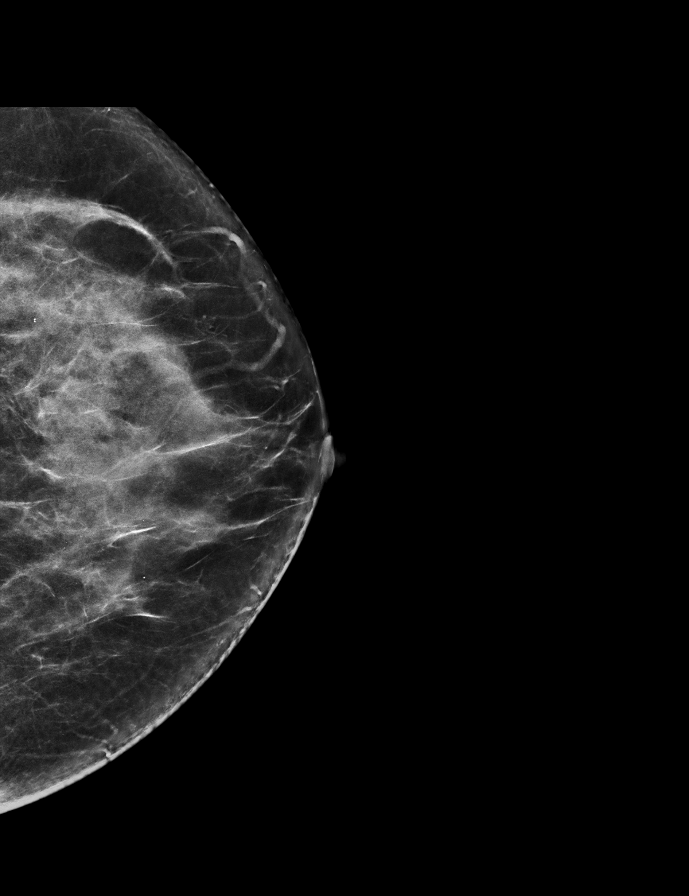

[R MLO]
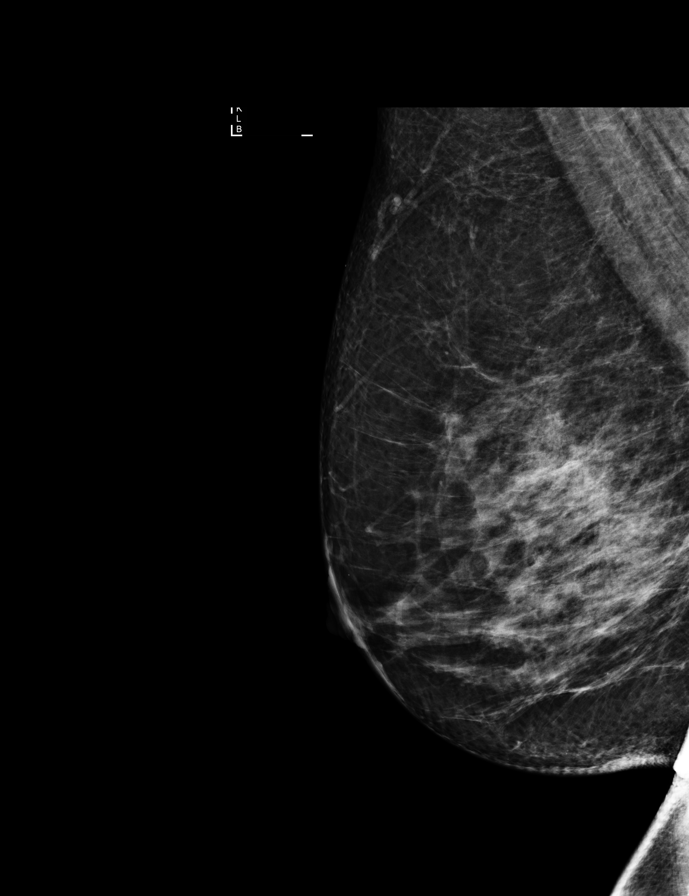

[L MLO]
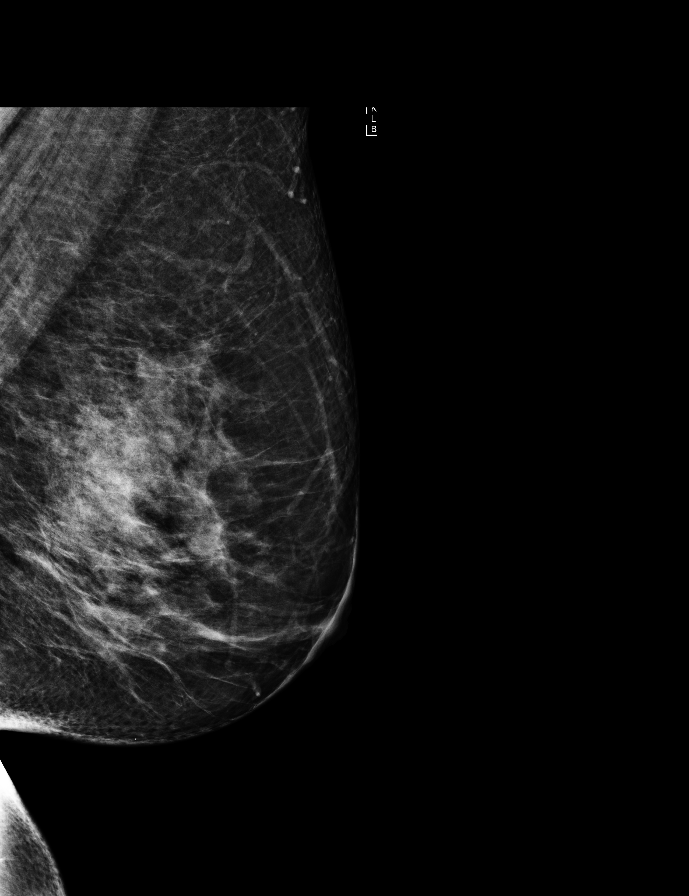

[R MLO synth-2D]
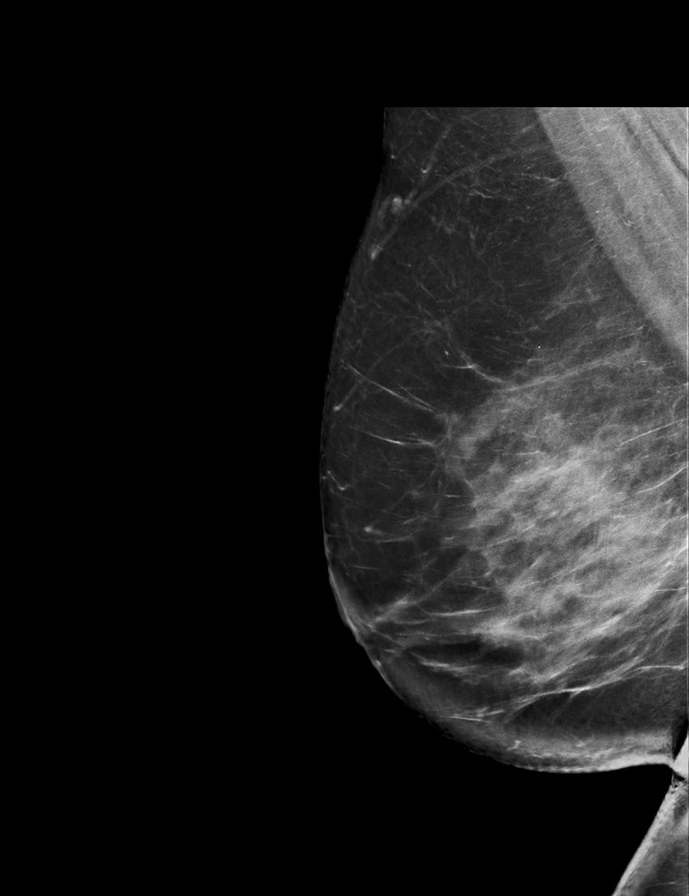

[L MLO tomo · tomo slice 45/90.0]
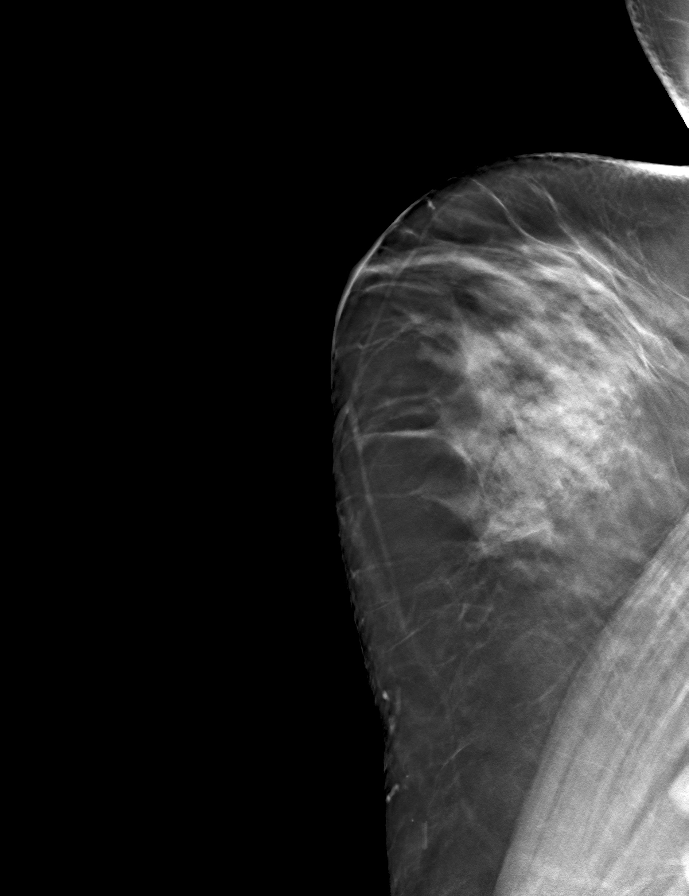

[9 of 28 positions shown; findings below may reference images not displayed]

ACR Breast Density Category c: The breast tissue is heterogeneously
dense, which may obscure small masses.
FINDINGS: There are no findings suspicious for malignancy. Images were
processed with CAD.
IMPRESSION: No mammographic evidence of malignancy. A result letter of this
screening mammogram will be mailed directly to the patient.

RECOMMENDATION:
Screening mammogram in one year. (Code:[id number])

BI-RADS CATEGORY  1: Negative.

## 2018-12-13 ENCOUNTER — Other Ambulatory Visit: Payer: Self-pay | Admitting: Family Medicine

## 2018-12-14 ENCOUNTER — Other Ambulatory Visit: Payer: Self-pay | Admitting: Family Medicine

## 2018-12-14 DIAGNOSIS — I10 Essential (primary) hypertension: Secondary | ICD-10-CM

## 2018-12-27 ENCOUNTER — Other Ambulatory Visit: Payer: Self-pay | Admitting: Family Medicine

## 2018-12-27 DIAGNOSIS — I10 Essential (primary) hypertension: Secondary | ICD-10-CM

## 2019-01-03 ENCOUNTER — Other Ambulatory Visit: Payer: Self-pay

## 2019-01-03 ENCOUNTER — Encounter: Payer: Self-pay | Admitting: Family Medicine

## 2019-01-03 ENCOUNTER — Ambulatory Visit (INDEPENDENT_AMBULATORY_CARE_PROVIDER_SITE_OTHER): Payer: BC Managed Care – PPO | Admitting: Family Medicine

## 2019-01-03 VITALS — BP 150/100 | HR 85 | Temp 97.1°F | Ht 64.0 in | Wt 202.1 lb

## 2019-01-03 DIAGNOSIS — M064 Inflammatory polyarthropathy: Secondary | ICD-10-CM | POA: Diagnosis not present

## 2019-01-03 DIAGNOSIS — E890 Postprocedural hypothyroidism: Secondary | ICD-10-CM

## 2019-01-03 DIAGNOSIS — E785 Hyperlipidemia, unspecified: Secondary | ICD-10-CM

## 2019-01-03 DIAGNOSIS — E1169 Type 2 diabetes mellitus with other specified complication: Secondary | ICD-10-CM | POA: Diagnosis not present

## 2019-01-03 DIAGNOSIS — F320 Major depressive disorder, single episode, mild: Secondary | ICD-10-CM | POA: Diagnosis not present

## 2019-01-03 DIAGNOSIS — I10 Essential (primary) hypertension: Secondary | ICD-10-CM

## 2019-01-03 DIAGNOSIS — Z23 Encounter for immunization: Secondary | ICD-10-CM

## 2019-01-03 DIAGNOSIS — E559 Vitamin D deficiency, unspecified: Secondary | ICD-10-CM | POA: Diagnosis not present

## 2019-01-03 DIAGNOSIS — E669 Obesity, unspecified: Secondary | ICD-10-CM

## 2019-01-03 DIAGNOSIS — K5909 Other constipation: Secondary | ICD-10-CM

## 2019-01-03 MED ORDER — METOPROLOL SUCCINATE ER 50 MG PO TB24: 50.0000 mg | ORAL_TABLET | Freq: Every day | ORAL | 0 refills | Status: DC

## 2019-01-03 MED ORDER — LINACLOTIDE 145 MCG PO CAPS: 145.0000 ug | ORAL_CAPSULE | Freq: Every day | ORAL | 0 refills | Status: DC

## 2019-01-03 MED ORDER — VITAMIN D (ERGOCALCIFEROL) 1.25 MG (50000 UNIT) PO CAPS: 50000.0000 [IU] | ORAL_CAPSULE | ORAL | 0 refills | Status: DC

## 2019-01-03 MED ORDER — AMLODIPINE BESYLATE 2.5 MG PO TABS: 2.5000 mg | ORAL_TABLET | Freq: Every day | ORAL | 0 refills | Status: DC

## 2019-01-03 MED ORDER — OLMESARTAN MEDOXOMIL-HCTZ 40-25 MG PO TABS: 1.0000 | ORAL_TABLET | Freq: Every day | ORAL | 0 refills | Status: DC

## 2019-01-03 NOTE — Progress Notes (Signed)
Name: Shelia Vasquez   MRN: [id number]    DOB: 1965/10/07   Date:01/03/2019       Progress Note  Subjective  Chief Complaint  Chief Complaint  Patient presents with  . Medication Refill  . Diabetes    Checks once daily Average-109   . Hypertension    Denies any symptoms  . Depression  . Hypothyroidism  . Gastroesophageal Reflux    I connected with  Shelia Vasquez  on 01/03/19 at  9:00 AM EDT by a telephone and verified that I am speaking with the correct person using two identifiers.  I discussed the limitations of evaluation and management by telemedicine and the availability of in person appointments. The patient expressed understanding and agreed to proceed. Staff also discussed with the patient that there may be a patient responsible charge related to this service. Patient Location: at home [facility]   HPI  DMII:diagnosed back in Dec 2018 with hgb of6.6%. She is also obese and has family history of DM, she is on life style modification only,eye exam is up to date. She denies polyphagia, polydipsia or polyuria at this time. Last urine micro was normal on 02/2020she is not taking medication and A1C back in Feb was 6.7%, she has decreased carbohydrate intake. Explained that she needs to come in to have A1C checked   HTN: not on norvasc because of edema,she is on Benicar/HCTZ andmetoprololand bp todaywas at goal back in Feb, but past visits have been virtual - advised her to have a flu shot and nurse visit with bp if possible, she states at home usually 120's130's-90's. Marland Kitchen. No chest pain or palpitation. We will increase dose of metoprolol since DBP has been in 90's  Major DepressionMild: She has a long history of depression - since her teenage years. Symptoms got worse in 2017 when her fatherdied. She has a 52 yo daughter withautism and does not like getting out of the house, she states her daughter is happy doing virtual  school.  She tried Duloxetine in the past but stopped on her own. She states she is doing okay, refuses medication or therapy at this timeShe is working from home and is having less lack of energy since COVID-19   Inflammatory arthritis: seen by Rheumatologistnow on Plaquenil, tolerating medication well. Eye exam is up to date and we will obtain a copy. Off prednisone. She statespain is mild- moderate, on hands and her lower back,2/10 , described as aching, pain goes up and down and can go in different area, wrist, back or ankles. No swelling at this timeor redness. She states since working from home she has been feeling better, she thinks because it is not as cold  Hypothyroidism:she states her scale broke not sure if she gained weight since last visit. No change in bowel movements . Skin is always dry, last TSH was normal , but we will recheck it today   GERD: taking Ranitidine prn, states occasionally has dysphagia, food gets stuck on upper esophagus, discussed referral to GIShe refuses to go at this time, she states it is not very oftenand she wants to avoid going because of COVID-19.    Patient Active Problem List   Diagnosis Date Noted  . Undifferentiated inflammatory arthritis (HCC) 08/09/2017  . Positive ANA (antinuclear antibody) 07/26/2017  . Diet-controlled type 2 diabetes mellitus (HCC) 07/12/2017  . Chronic midline low back pain without sciatica 08/24/2016  . Sedimentation rate elevation 07/18/2016  . Elevated C-reactive  protein (CRP) 12/09/2015  . Hyperglycemia 12/09/2015  . Benign hypertension 02/06/2015  . Dyslipidemia 02/06/2015  . Insomnia 02/06/2015  . Gastro-esophageal reflux disease without esophagitis 02/06/2015  . History of radioactive iodine thyroid ablation 02/06/2015  . Hypothyroidism, postablative 02/06/2015  . History of iron deficiency anemia 02/06/2015  . Obesity (BMI 30.0-34.9) 02/06/2015  . H/O: hysterectomy 02/06/2015  . Vitamin D deficiency  02/06/2015  . Seasonal allergic rhinitis 02/06/2015  . Chronic constipation 02/06/2015  . History of uterine fibroid 05/29/2013    Past Surgical History:  Procedure Laterality Date  . ABDOMINAL HYSTERECTOMY    . BTL    . CESAREAN SECTION    . COLONOSCOPY WITH PROPOFOL N/A 03/07/2017   Procedure: COLONOSCOPY WITH PROPOFOL;  Surgeon: Midge MiniumWohl, Darren, MD;  [facility];  Service: Endoscopy;  Laterality: N/A;  . TUBAL LIGATION      Family History  Problem Relation Age of Onset  . Arthritis Father   . Autism Daughter   . Breast cancer Neg Hx     Social History   Socioeconomic History  . Marital status: Married    Spouse name: Jomarie LongsJoseph   . Number of children: 1  . Years of education: Not on file  . Highest education level: Some college, no degree  Occupational History  . Not on file  Social Needs  . Financial resource strain: Not hard at all  . Food insecurity    Worry: Never true    Inability: Never true  . Transportation needs    Medical: No    Non-medical: No  Tobacco Use  . Smoking status: Never Smoker  . Smokeless tobacco: Never Used  Substance and Sexual Activity  . Alcohol use: No    Alcohol/week: 0.0 standard drinks  . Drug use: No  . Sexual activity: Yes    Partners: Male  Lifestyle  . Physical activity    Days per week: 0 days    Minutes per session: 0 min  . Stress: Rather much  Relationships  . Social Musicianconnections    Talks on phone: Three times a week    Gets together: Three times a week    Attends religious service: More than 4 times per year    Active member of club or organization: No    Attends meetings of clubs or organizations: Never    Relationship status: Married  . Intimate partner violence    Fear of current or ex partner: No    Emotionally abused: No    Physically abused: No    Forced sexual activity: No  Other Topics Concern  . Not on file  Social History Narrative   She is married   Husband has a son   They have a  daughter together, she was diagnosed with autism at age 53     Current Outpatient Medications:  .  aspirin EC 81 MG tablet, Take 1 tablet by mouth daily., Disp: , Rfl:  .  atorvastatin (LIPITOR) 10 MG tablet, TAKE 1 TABLET BY MOUTH EVERY DAY, Disp: 90 tablet, Rfl: 1 .  cholecalciferol (VITAMIN D3) 25 MCG (1000 UT) tablet, Take 1,000 Units by mouth daily., Disp: , Rfl:  .  ferrous sulfate 325 (65 FE) MG tablet, Take 325 mg by mouth daily., Disp: , Rfl:  .  fluticasone (FLONASE) 50 MCG/ACT nasal spray, Place 2 sprays into both nostrils daily., Disp: 16 g, Rfl: 6 .  hydroxychloroquine (PLAQUENIL) 200 MG tablet, Take 1 tablet by mouth 2 (two) times daily., Disp: ,  Rfl:  .  hydrOXYzine (ATARAX/VISTARIL) 10 MG tablet, TAKE 1 TABLET BY MOUTH TWICE A DAY, Disp: 180 tablet, Rfl: 1 .  levothyroxine (SYNTHROID) 125 MCG tablet, Take 1 tablet (125 mcg total) by mouth daily., Disp: 90 tablet, Rfl: 0 .  linaclotide (LINZESS) 145 MCG CAPS capsule, Take 1 capsule (145 mcg total) by mouth daily., Disp: 90 capsule, Rfl: 0 .  loratadine (CLARITIN REDITABS) 10 MG dissolvable tablet, Take 1 tablet (10 mg total) by mouth daily., Disp: 30 tablet, Rfl: 2 .  metoprolol succinate (TOPROL-XL) 50 MG 24 hr tablet, Take 1 tablet (50 mg total) by mouth daily., Disp: 90 tablet, Rfl: 0 .  olmesartan-hydrochlorothiazide (BENICAR HCT) 40-25 MG tablet, Take 1 tablet by mouth daily., Disp: 90 tablet, Rfl: 0 .  Vitamin D, Ergocalciferol, (DRISDOL) 1.25 MG (50000 UT) CAPS capsule, Take 1 capsule (50,000 Units total) by mouth every 7 (seven) days., Disp: 12 capsule, Rfl: 0 .  ibuprofen (ADVIL,MOTRIN) 600 MG tablet, Take by mouth., Disp: , Rfl:   No Known Allergies  I personally reviewed active problem list, medication list, allergies, family history, social history, health maintenance with the patient/caregiver today.   ROS  Ten systems reviewed and is negative except as mentioned in HPI   Objective  Virtual encounter  Vitals:   01/03/19 0821 01/03/19 1135  BP: 126/90 (!) 150/100  Pulse: 81 85  Temp:  (!) 97.1 F (36.2 C)  SpO2:  95%    Body mass index is 34.69 kg/m.  Physical Exam  Awake, alert and oriented   PHQ2/9: Depression screen [facility] 2/9 01/03/2019 09/28/2018 06/19/2018 02/16/2018 10/12/2017  Decreased Interest 0 2 1 0 2  Down, Depressed, Hopeless 0 0 0 0 2  PHQ - 2 Score 0 2 1 0 4  Altered sleeping 0 0 1 1 2   Tired, decreased energy 0 0 1 3 2   Change in appetite 1 0 3 0 2  Feeling bad or failure about yourself  0 0 0 0 0  Trouble concentrating 0 0 0 0 1  Moving slowly or fidgety/restless 0 0 0 0 0  Suicidal thoughts 0 0 0 0 0  PHQ-9 Score 1 2 6 4 11   Difficult doing work/chores Not difficult at all Not difficult at all Not difficult at all Not difficult at all Not difficult at all   PHQ-2/9 Result is negative.    Fall Risk: Fall Risk  01/03/2019 09/28/2018 06/19/2018 02/16/2018 10/12/2017  Falls in the past year? 0 0 0 No No  Number falls in past yr: 0 0 - - -  Injury with Fall? 0 0 - - -     Assessment & Plan  1. Undifferentiated inflammatory arthritis (HCC)  - Sedimentation rate  2. Diabetes mellitus type 2 in obese (HCC)  - Hemoglobin A1c  3. Vitamin D deficiency  - Vitamin D, Ergocalciferol, (DRISDOL) 1.25 MG (50000 UT) CAPS capsule; Take 1 capsule (50,000 Units total) by mouth every 7 (seven) days.  Dispense: 12 capsule; Refill: 0  4. Mild major depression ([place])  Doing well   5. Benign hypertension  BP in our office was still high, we will try adding low dose norvasc back - CBC with Differential/Platelet - COMPLETE METABOLIC PANEL WITH GFR - metoprolol succinate (TOPROL-XL) 50 MG 24 hr tablet; Take 1 tablet (50 mg total) by mouth daily.  Dispense: 90 tablet; Refill: 0 - olmesartan-hydrochlorothiazide (BENICAR HCT) 40-25 MG tablet; Take 1 tablet by mouth daily.  Dispense: 90 tablet; Refill: 0  6. Hypothyroidism, postablative  - TSH  7. Dyslipidemia  8.  Needs flu shot  - Flu Vaccine MDCK QUAD PF  9. Chronic constipation  - linaclotide (LINZESS) 145 MCG CAPS capsule; Take 1 capsule (145 mcg total) by mouth daily.  Dispense: 90 capsule; Refill: 0  I discussed the assessment and treatment plan with the patient. The patient was provided an opportunity to ask questions and all were answered. The patient agreed with the plan and demonstrated an understanding of the instructions.  The patient was advised to call back or seek an in-person evaluation if the symptoms worsen or if the condition fails to improve as anticipated.  I provided 25  minutes of non-face-to-face time during this encounter.

## 2019-01-04 LAB — COMPLETE METABOLIC PANEL WITH GFR
AG Ratio: 1.3 (calc) (ref 1.0–2.5)
ALT: 16 U/L (ref 6–29)
AST: 23 U/L (ref 10–35)
Albumin: 4.5 g/dL (ref 3.6–5.1)
Alkaline phosphatase (APISO): 76 U/L (ref 37–153)
BUN: 17 mg/dL (ref 7–25)
CO2: 28 mmol/L (ref 20–32)
Calcium: 10.6 mg/dL — ABNORMAL HIGH (ref 8.6–10.4)
Chloride: 98 mmol/L (ref 98–110)
Creat: 0.93 mg/dL (ref 0.50–1.05)
GFR, Est African American: 82 mL/min/{1.73_m2} (ref >=60)
GFR, Est Non African American: 71 mL/min/{1.73_m2} (ref >=60)
Globulin: 3.5 g/dL (calc) (ref 1.9–3.7)
Glucose, Bld: 96 mg/dL (ref 65–99)
Potassium: 3.5 mmol/L (ref 3.5–5.3)
Sodium: 140 mmol/L (ref 135–146)
Total Bilirubin: 0.3 mg/dL (ref 0.2–1.2)
Total Protein: 8 g/dL (ref 6.1–8.1)

## 2019-01-04 LAB — HEMOGLOBIN A1C
Hgb A1c MFr Bld: 6.9 % of total Hgb — ABNORMAL HIGH (ref <5.7)
Mean Plasma Glucose: 151 (calc)
eAG (mmol/L): 8.4 (calc)

## 2019-01-04 LAB — SEDIMENTATION RATE: Sed Rate: 58 mm/h — ABNORMAL HIGH (ref 0–30)

## 2019-01-04 LAB — CBC WITH DIFFERENTIAL/PLATELET
Absolute Monocytes: 598 cells/uL (ref 200–950)
Basophils Absolute: 64 cells/uL (ref 0–200)
Basophils Relative: 0.7 %
Eosinophils Absolute: 239 cells/uL (ref 15–500)
Eosinophils Relative: 2.6 %
HCT: 35.2 % (ref 35.0–45.0)
Hemoglobin: 11.3 g/dL — ABNORMAL LOW (ref 11.7–15.5)
Lymphs Abs: 3183 cells/uL (ref 850–3900)
MCH: 25.5 pg — ABNORMAL LOW (ref 27.0–33.0)
MCHC: 32.1 g/dL (ref 32.0–36.0)
MCV: 79.5 fL — ABNORMAL LOW (ref 80.0–100.0)
MPV: 9.8 fL (ref 7.5–12.5)
Monocytes Relative: 6.5 %
Neutro Abs: 5115 cells/uL (ref 1500–7800)
Neutrophils Relative %: 55.6 %
Platelets: 380 10*3/uL (ref 140–400)
RBC: 4.43 10*6/uL (ref 3.80–5.10)
RDW: 14.9 % (ref 11.0–15.0)
Total Lymphocyte: 34.6 %
WBC: 9.2 10*3/uL (ref 3.8–10.8)

## 2019-01-04 LAB — TSH: TSH: 5.2 mIU/L — ABNORMAL HIGH

## 2019-01-13 ENCOUNTER — Encounter: Payer: Self-pay | Admitting: Family Medicine

## 2019-04-04 ENCOUNTER — Ambulatory Visit: Payer: BC Managed Care – PPO | Admitting: Family Medicine

## 2019-04-12 ENCOUNTER — Other Ambulatory Visit: Payer: Self-pay | Admitting: Family Medicine

## 2019-04-13 ENCOUNTER — Other Ambulatory Visit: Payer: Self-pay | Admitting: Family Medicine

## 2019-04-13 DIAGNOSIS — E559 Vitamin D deficiency, unspecified: Secondary | ICD-10-CM

## 2019-04-13 DIAGNOSIS — E890 Postprocedural hypothyroidism: Secondary | ICD-10-CM

## 2019-04-13 DIAGNOSIS — I10 Essential (primary) hypertension: Secondary | ICD-10-CM

## 2019-04-16 NOTE — Telephone Encounter (Signed)
Tried to call patient several times to have her come in for TSH labs. Left message.

## 2019-05-27 ENCOUNTER — Ambulatory Visit: Payer: BC Managed Care – PPO | Admitting: Family Medicine

## 2019-06-22 ENCOUNTER — Other Ambulatory Visit: Payer: Self-pay | Admitting: Family Medicine

## 2019-06-22 NOTE — Telephone Encounter (Signed)
Requested Prescriptions  Pending Prescriptions Disp Refills  . atorvastatin (LIPITOR) 10 MG tablet [Pharmacy Med Name: ATORVASTATIN 10 MG TABLET] 90 tablet 1    Sig: TAKE 1 TABLET BY MOUTH EVERY DAY     Cardiovascular:  Antilipid - Statins Failed - 06/22/2019  9:34 AM      Failed - Total Cholesterol in normal range and within 360 days    Cholesterol, Total  Date Value Ref Range Status  04/01/2015 220 (H) 100 - 199 mg/dL Final   Cholesterol  Date Value Ref Range Status  06/19/2018 172 <200 mg/dL Final         Failed - LDL in normal range and within 360 days    LDL Cholesterol (Calc)  Date Value Ref Range Status  06/19/2018 63 mg/dL (calc) Final    Comment:    Reference range: <100 . Desirable range <100 mg/dL for primary prevention;   <70 mg/dL for patients with CHD or diabetic patients  with > or = 2 CHD risk factors. Marland Kitchen LDL-C is now calculated using the Martin-Hopkins  calculation, which is a validated novel method providing  better accuracy than the Friedewald equation in the  estimation of LDL-C.  Horald Pollen et al. Lenox Ahr. 3976;734(19): 2061-2068  (http://education.[facility].com/faq/FAQ164)          Failed - HDL in normal range and within 360 days    HDL  Date Value Ref Range Status  06/19/2018 94 > OR = 50 mg/dL Final  37/90/2409 735 >32 mg/dL Final         Failed - Triglycerides in normal range and within 360 days    Triglycerides  Date Value Ref Range Status  06/19/2018 70 <150 mg/dL Final         Passed - Patient is not pregnant      Passed - Valid encounter within last 12 months    Recent Outpatient Visits          5 months ago Undifferentiated inflammatory arthritis [facility])   [facility] Alba Cory, MD   8 months ago Hypothyroidism, postablative   [facility] [place], Danna Hefty, MD   1 year ago Diabetes mellitus type 2 in obese [facility])   [facility] Alba Cory, MD   1 year ago  Diabetes mellitus type 2 in obese [facility])   [facility] Alba Cory, MD   1 year ago Diabetes mellitus type 2 in obese [facility])   [facility] Alba Cory, MD

## 2019-06-28 ENCOUNTER — Encounter: Payer: Self-pay | Admitting: Family Medicine

## 2019-07-02 ENCOUNTER — Other Ambulatory Visit: Payer: Self-pay | Admitting: Family Medicine

## 2019-07-02 DIAGNOSIS — E559 Vitamin D deficiency, unspecified: Secondary | ICD-10-CM

## 2019-07-02 NOTE — Telephone Encounter (Signed)
Requested medications are due for refill today?  Yes - This medication is non-delegated.    Requested medications are on active medication list?  Yes  Last Refill:   04/14/2019 # 12 with no refills  Future visit scheduled?  Yes  Notes to Clinic:

## 2019-07-08 ENCOUNTER — Other Ambulatory Visit: Payer: Self-pay | Admitting: Family Medicine

## 2019-07-08 DIAGNOSIS — I10 Essential (primary) hypertension: Secondary | ICD-10-CM

## 2019-07-08 NOTE — Telephone Encounter (Signed)
Requested Prescriptions  Pending Prescriptions Disp Refills  . amLODipine (NORVASC) 2.5 MG tablet [Pharmacy Med Name: AMLODIPINE BESYLATE 2.5 MG TAB] 90 tablet 0    Sig: TAKE 1 TABLET BY MOUTH EVERY DAY     Cardiovascular:  Calcium Channel Blockers Failed - 07/08/2019  1:30 AM      Failed - Last BP in normal range    BP Readings from Last 1 Encounters:  01/03/19 (!) 150/100         Failed - Valid encounter within last 6 months    Recent Outpatient Visits          6 months ago Undifferentiated inflammatory arthritis [facility])   [facility] Alba Cory, MD   9 months ago Hypothyroidism, postablative   [facility] [place], Danna Hefty, MD   1 year ago Diabetes mellitus type 2 in obese [facility])   [facility] Alba Cory, MD   1 year ago Diabetes mellitus type 2 in obese [facility])   [facility] Al[facility], MD   1 year ago Diabetes mellitus type 2 in obese [facility])   [facility] [place], Danna Hefty, MD      Future Appointments            In 3 weeks Alba Cory, MD [facility]           . metoprolol succinate (TOPROL-XL) 50 MG 24 hr tablet [Pharmacy Med Name: METOPROLOL SUCC ER 50 MG TAB] 90 tablet 0    Sig: TAKE 1 TABLET BY MOUTH EVERY DAY     Cardiovascular:  Beta Blockers Failed - 07/08/2019  1:30 AM      Failed - Last BP in normal range    BP Readings from Last 1 Encounters:  01/03/19 (!) 150/100         Failed - Valid encounter within last 6 months    Recent Outpatient Visits          6 months ago Undifferentiated inflammatory arthritis [facility])   [facility] Alba Cory, MD   9 months ago Hypothyroidism, postablative   [facility], Danna Hefty, MD   1 year ago Diabetes mellitus type 2 in obese [facility])   [facility] Alba Cory, MD   1 year ago Diabetes mellitus type 2 in obese [facility])    [facility] Alba Cory, MD   1 year ago Diabetes mellitus type 2 in obese [facility])   [facility] Alba Cory, MD      Future Appointments            In 3 weeks Alba Cory, MD [facility]           Passed - Last Heart Rate in normal range    Pulse Readings from Last 1 Encounters:  01/03/19 85

## 2019-07-31 ENCOUNTER — Ambulatory Visit (INDEPENDENT_AMBULATORY_CARE_PROVIDER_SITE_OTHER): Payer: 59 | Admitting: Family Medicine

## 2019-07-31 ENCOUNTER — Other Ambulatory Visit: Payer: Self-pay

## 2019-07-31 ENCOUNTER — Encounter: Payer: Self-pay | Admitting: Family Medicine

## 2019-07-31 VITALS — BP 140/100 | HR 62 | Temp 96.9°F | Resp 16 | Ht 64.0 in | Wt 208.8 lb

## 2019-07-31 DIAGNOSIS — E785 Hyperlipidemia, unspecified: Secondary | ICD-10-CM

## 2019-07-31 DIAGNOSIS — E1169 Type 2 diabetes mellitus with other specified complication: Secondary | ICD-10-CM | POA: Diagnosis not present

## 2019-07-31 DIAGNOSIS — E890 Postprocedural hypothyroidism: Secondary | ICD-10-CM | POA: Diagnosis not present

## 2019-07-31 DIAGNOSIS — R109 Unspecified abdominal pain: Secondary | ICD-10-CM

## 2019-07-31 DIAGNOSIS — F411 Generalized anxiety disorder: Secondary | ICD-10-CM

## 2019-07-31 DIAGNOSIS — I10 Essential (primary) hypertension: Secondary | ICD-10-CM | POA: Diagnosis not present

## 2019-07-31 DIAGNOSIS — E669 Obesity, unspecified: Secondary | ICD-10-CM

## 2019-07-31 DIAGNOSIS — M064 Inflammatory polyarthropathy: Secondary | ICD-10-CM

## 2019-07-31 DIAGNOSIS — K219 Gastro-esophageal reflux disease without esophagitis: Secondary | ICD-10-CM

## 2019-07-31 DIAGNOSIS — E559 Vitamin D deficiency, unspecified: Secondary | ICD-10-CM

## 2019-07-31 LAB — POCT GLYCOSYLATED HEMOGLOBIN (HGB A1C): Hemoglobin A1C: 7 % — AB (ref 4.0–5.6)

## 2019-07-31 LAB — POCT URINALYSIS DIPSTICK
Appearance: NORMAL
Bilirubin, UA: NEGATIVE
Blood, UA: POSITIVE
Glucose, UA: NEGATIVE
Ketones, UA: NEGATIVE
Leukocytes, UA: NEGATIVE
Nitrite, UA: NEGATIVE
Odor: NORMAL
Protein, UA: NEGATIVE
Spec Grav, UA: 1.025 (ref 1.010–1.025)
Urobilinogen, UA: 0.2 E.U./dL
pH, UA: 6 (ref 5.0–8.0)

## 2019-07-31 LAB — POCT UA - MICROALBUMIN: Microalbumin Ur, POC: 50 mg/L

## 2019-07-31 MED ORDER — LEVOTHYROXINE SODIUM 125 MCG PO TABS: 125.0000 ug | ORAL_TABLET | Freq: Every day | ORAL | 0 refills | Status: DC

## 2019-07-31 MED ORDER — LEVOTHYROXINE SODIUM 100 MCG PO TABS: 100.0000 ug | ORAL_TABLET | Freq: Every day | ORAL | 1 refills | Status: DC

## 2019-07-31 MED ORDER — AMLODIPINE BESYLATE 2.5 MG PO TABS: 2.5000 mg | ORAL_TABLET | Freq: Every day | ORAL | 0 refills | Status: DC

## 2019-07-31 MED ORDER — OLMESARTAN MEDOXOMIL-HCTZ 40-25 MG PO TABS: 1.0000 | ORAL_TABLET | Freq: Every day | ORAL | 0 refills | Status: DC

## 2019-07-31 MED ORDER — CIPROFLOXACIN HCL 250 MG PO TABS: 250.0000 mg | ORAL_TABLET | Freq: Two times a day (BID) | ORAL | 0 refills | Status: DC

## 2019-07-31 MED ORDER — METOPROLOL SUCCINATE ER 50 MG PO TB24: 50.0000 mg | ORAL_TABLET | Freq: Every day | ORAL | 0 refills | Status: DC

## 2019-07-31 NOTE — Progress Notes (Signed)
Name: Shelia Vasquez   MRN: [id number]    DOB: 07/10/1965   Date:07/31/2019       Progress Note  Subjective  Chief Complaint  Chief Complaint  Patient presents with  . Diabetes  . Hypertension  . Gastroesophageal Reflux  . Hypothyroidism  . Hyperglycemia  . Flank Pain    She has been having some right flank pain x 1 week that comes and goes. Pain is sharp. She did a home UTI test and it was positive    HPI  DMII:diagnosed back in Dec 2018 with hgb of6.6%, A1C today is up to 7% . She is also obese and has family history of DM, she is on life style modification only,eye exam is up to date. She denies polyphagia, polydipsia or polyuria at this time.Last urine micro was normal on 02/2020we will recheck it today    HTN: she has not been taking medications consistently and skipped Benicar/HCTZ this morning because it causes increase in urinary frequency. Advised to take all medications around 5 pm to increase compliance.BP at home is better controlled 120's/80's 90's. No chest pain or palpitation.  Major DepressionMild: She has a long history of depression - since her teenage years. Symptoms got worse in 2017 when her fatherdied. She has a 53 yo daughter withautism and does not like getting out of the house, she states her daughter is happy doing virtual school.  She tried Duloxetine in the past but stopped on her own. She states she is doing okay and does not want medications. Working from home is helpful.   Inflammatory arthritis: seen by Rheumatologistshe was on Plaquenil but stopped on her own because it was not helping her, explained there are other therapies and she needs to follow up with Dr. Meda Coffee to discuss options.   She states pain is zero right now, symptoms are intermittent and has pain on ankles and swelling at times, she also has pain on hands, and lower back.   Hypothyroidism:she has been out of medication for the past week, she skips a lot of doses because  it makes her feel weak. She was given medication over 6 months ago. We will adjust dose to a lower amount and recheck level in 6 weeks.   GERD: taking Ranitidine prn, states occasionally has dysphagia, food gets stuck on upper esophagus, she states she has been chewing food more and is not having problems recently. Still advised evaluation by GI   Chronic constipation: takes LInzess occasionally because of cost, no blood in stools or change in bowel movements . She has bowel movements about once a day or less.   GAD: taking hydroxizine prn and stable  Flank pain: she states she noticed a pain on right upper back that radiated to right lower quadrant over the past week, no fever, chills, nausea or vomiting, no blood in the urine, but she has noticed urinary frequency, urgency and hesitancy. No personal history of kidney stones, no family history of kidney stones. Pain is sharp , intermittent and happens 5-7 times per day and lasts about 1-2 minutes , not waking her up at night.    Patient Active Problem List   Diagnosis Date Noted  . Undifferentiated inflammatory arthritis ([place]) 08/09/2017  . Positive ANA (antinuclear antibody) 07/26/2017  . Diet-controlled type 2 diabetes mellitus ([place]) 07/12/2017  . Chronic midline low back pain without sciatica 08/24/2016  . Sedimentation rate elevation 07/18/2016  . Elevated C-reactive protein (CRP) 12/09/2015  . Hyperglycemia 12/09/2015  .  Benign hypertension 02/06/2015  . Dyslipidemia 02/06/2015  . Insomnia 02/06/2015  . Gastro-esophageal reflux disease without esophagitis 02/06/2015  . History of radioactive iodine thyroid ablation 02/06/2015  . Hypothyroidism, postablative 02/06/2015  . History of iron deficiency anemia 02/06/2015  . Obesity (BMI 30.0-34.9) 02/06/2015  . H/O: hysterectomy 02/06/2015  . Vitamin D deficiency 02/06/2015  . Seasonal allergic rhinitis 02/06/2015  . Chronic constipation 02/06/2015  . History of uterine fibroid  05/29/2013    Past Surgical History:  Procedure Laterality Date  . ABDOMINAL HYSTERECTOMY    . BTL    . CESAREAN SECTION    . COLONOSCOPY WITH PROPOFOL N/A 03/07/2017   Procedure: COLONOSCOPY WITH PROPOFOL;  Surgeon: Midge Minium, MD;  [facility];  Service: Endoscopy;  Laterality: N/A;  . TUBAL LIGATION      Family History  Problem Relation Age of Onset  . Arthritis Father   . Autism Daughter   . Breast cancer Neg Hx     Social History   Tobacco Use  . Smoking status: Never Smoker  . Smokeless tobacco: Never Used  Substance Use Topics  . Alcohol use: No    Alcohol/week: 0.0 standard drinks     Current Outpatient Medications:  Marland Kitchen  Vitamin D, Ergocalciferol, (DRISDOL) 1.25 MG (50000 UNIT) CAPS capsule, TAKE 1 CAPSULE (50,000 UNITS TOTAL) BY MOUTH EVERY 7 (SEVEN) DAYS., Disp: 12 capsule, Rfl: 0 .  amLODipine (NORVASC) 2.5 MG tablet, Take 1 tablet (2.5 mg total) by mouth daily., Disp: 90 tablet, Rfl: 0 .  aspirin EC 81 MG tablet, Take 1 tablet by mouth daily., Disp: , Rfl:  .  atorvastatin (LIPITOR) 10 MG tablet, TAKE 1 TABLET BY MOUTH EVERY DAY, Disp: 90 tablet, Rfl: 1 .  cholecalciferol (VITAMIN D3) 25 MCG (1000 UT) tablet, Take 1,000 Units by mouth daily., Disp: , Rfl:  .  ciprofloxacin (CIPRO) 250 MG tablet, Take 1 tablet (250 mg total) by mouth 2 (two) times daily., Disp: 14 tablet, Rfl: 0 .  ferrous sulfate 325 (65 FE) MG tablet, Take 325 mg by mouth daily., Disp: , Rfl:  .  fluticasone (FLONASE) 50 MCG/ACT nasal spray, Place 2 sprays into both nostrils daily., Disp: 16 g, Rfl: 6 .  hydrOXYzine (ATARAX/VISTARIL) 10 MG tablet, TAKE 1 TABLET BY MOUTH TWICE A DAY, Disp: 180 tablet, Rfl: 1 .  ibuprofen (ADVIL,MOTRIN) 600 MG tablet, Take by mouth., Disp: , Rfl:  .  levothyroxine (SYNTHROID) 100 MCG tablet, Take 1 tablet (100 mcg total) by mouth daily., Disp: 30 tablet, Rfl: 1 .  linaclotide (LINZESS) 145 MCG CAPS capsule, Take 1 capsule (145 mcg total) by mouth  daily., Disp: 90 capsule, Rfl: 0 .  loratadine (CLARITIN REDITABS) 10 MG dissolvable tablet, Take 1 tablet (10 mg total) by mouth daily., Disp: 30 tablet, Rfl: 2 .  metoprolol succinate (TOPROL-XL) 50 MG 24 hr tablet, Take 1 tablet (50 mg total) by mouth daily. Take with or immediately following a meal., Disp: 90 tablet, Rfl: 0 .  olmesartan-hydrochlorothiazide (BENICAR HCT) 40-25 MG tablet, Take 1 tablet by mouth daily., Disp: 90 tablet, Rfl: 0  No Known Allergies  I personally reviewed active problem list, medication list, family history and social history  with the patient/caregiver today.   ROS  Constitutional: Negative for fever or weight change.  Respiratory: Negative for cough and shortness of breath.   Cardiovascular: Negative for chest pain or palpitations.  Gastrointestinal: Negative for abdominal pain, no bowel changes.  Musculoskeletal: Negative for gait problem or joint  swelling.  Skin: Negative for rash.  Neurological: Negative for dizziness or headache.  No other specific complaints in a complete review of systems (except as listed in HPI above).  Objective  Vitals:   07/31/19 0959  BP: (!) 140/100  Pulse: 62  Resp: 16  Temp: (!) 96.9 F (36.1 C)  TempSrc: Temporal  SpO2: 98%  Weight: 208 lb 12.8 oz (94.7 kg)  Height: 5\' 4"  (1.626 m)    Body mass index is 35.84 kg/m.  Physical Exam  Constitutional: Patient appears well-developed and well-nourished. Obese No distress.  HEENT: head atraumatic, normocephalic, pupils equal and reactive to light Cardiovascular: Normal rate, regular rhythm and normal heart sounds.  No murmur heard. No BLE edema. Pulmonary/Chest: Effort normal and breath sounds normal. No respiratory distress. Abdominal: Soft.  There is no tenderness. Negative CVA tenderness  Psychiatric: Patient has a normal mood and affect. behavior is normal. Judgment and thought content normal.  Recent Results (from the past 2160 hour(s))  POCT HgB A1C      Status: Abnormal   Collection Time: 07/31/19 10:05 AM  Result Value Ref Range   Hemoglobin A1C 7.0 (A) 4.0 - 5.6 %   HbA1c POC (<> result, manual entry)     HbA1c, POC (prediabetic range)     HbA1c, POC (controlled diabetic range)    POCT Urinalysis Dipstick     Status: Abnormal   Collection Time: 07/31/19 10:06 AM  Result Value Ref Range   Color, UA yellow    Clarity, UA Clear    Glucose, UA Negative Negative   Bilirubin, UA Negative    Ketones, UA Negative    Spec Grav, UA 1.025 1.010 - 1.025   Blood, UA Positive    pH, UA 6.0 5.0 - 8.0   Protein, UA Negative Negative   Urobilinogen, UA 0.2 0.2 or 1.0 E.U./dL   Nitrite, UA Negative    Leukocytes, UA Negative Negative   Appearance Normal    Odor Normal     Diabetic Foot Exam: Diabetic Foot Exam - Simple   Simple Foot Form Diabetic Foot exam was performed with the following findings: Yes 07/31/2019 10:40 AM  Visual Inspection No deformities, no ulcerations, no other skin breakdown bilaterally: Yes Sensation Testing Intact to touch and monofilament testing bilaterally: Yes Pulse Check Posterior Tibialis and Dorsalis pulse intact bilaterally: Yes Comments      PHQ2/9: Depression screen [facility] 2/9 01/03/2019 09/28/2018 06/19/2018 02/16/2018 10/12/2017  Decreased Interest 0 2 1 0 2  Down, Depressed, Hopeless 0 0 0 0 2  PHQ - 2 Score 0 2 1 0 4  Altered sleeping 0 0 1 1 2   Tired, decreased energy 0 0 1 3 2   Change in appetite 1 0 3 0 2  Feeling bad or failure about yourself  0 0 0 0 0  Trouble concentrating 0 0 0 0 1  Moving slowly or fidgety/restless 0 0 0 0 0  Suicidal thoughts 0 0 0 0 0  PHQ-9 Score 1 2 6 4 11   Difficult doing work/chores Not difficult at all Not difficult at all Not difficult at all Not difficult at all Not difficult at all    phq 9 is negative   Fall Risk: Fall Risk  01/03/2019 09/28/2018 06/19/2018 02/16/2018 10/12/2017  Falls in the past year? 0 0 0 No No  Number falls in past yr: 0 0 - - -  Injury  with Fall? 0 0 - - -    Assessment & Plan  1. Acute right flank pain  - POCT Urinalysis Dipstick - CULTURE, URINE COMPREHENSIVE - ciprofloxacin (CIPRO) 250 MG tablet; Take 1 tablet (250 mg total) by mouth 2 (two) times daily.  Dispense: 14 tablet; Refill: 0  Discussed imaging, but she would like to try antibiotics and call back if no improvement   2. Diabetes mellitus type 2 in obese (HCC)  - POCT HgB A1C A1C went up to 7.0%, she will resume a diabetic diet , she prefers not starting on medications at this time  3. Hypothyroidism, postablative  - levothyroxine (SYNTHROID) 125 MCG tablet; Take 1 tablet (125 mcg total) by mouth daily.  Dispense: 90 tablet; Refill: 0 TSH  4. Benign hypertension  - amLODipine (NORVASC) 2.5 MG tablet; Take 1 tablet (2.5 mg total) by mouth daily.  Dispense: 90 tablet; Refill: 0 - metoprolol succinate (TOPROL-XL) 50 MG 24 hr tablet; Take 1 tablet (50 mg total) by mouth daily. Take with or immediately following a meal.  Dispense: 90 tablet; Refill: 0 - olmesartan-hydrochlorothiazide (BENICAR HCT) 40-25 MG tablet; Take 1 tablet by mouth daily.  Dispense: 90 tablet; Refill: 0 - COMPLETE METABOLIC PANEL WITH GFR - CBC with Differential/Platelet  5. Dyslipidemia  - Lipid panel  6. Vitamin D deficiency  - VITAMIN D 25 Hydroxy (Vit-D Deficiency, Fractures)  7. GAD (generalized anxiety disorder)  Taking medication prn   8. Gastro-esophageal reflux disease without esophagitis  Not on medication  9. Undifferentiated inflammatory arthritis (HCC)  Advised her to contact Dr. Renard Matter for a follow up  10. Hypercalcemia  - Parathyroid hormone, intact (no Ca)   11. Morbid obesity (HCC)  BMI above 35 with co-morbidities  ( DM, HTN, GERD) , explained importance of weight loss, discussed a carbohydrate restrictive diet

## 2019-09-11 ENCOUNTER — Other Ambulatory Visit: Payer: Self-pay

## 2019-09-11 ENCOUNTER — Ambulatory Visit: Payer: 59

## 2019-09-13 LAB — CBC WITH DIFFERENTIAL/PLATELET
Absolute Monocytes: 486 cells/uL (ref 200–950)
Basophils Absolute: 53 cells/uL (ref 0–200)
Basophils Relative: 0.7 %
Eosinophils Absolute: 182 cells/uL (ref 15–500)
Eosinophils Relative: 2.4 %
HCT: 34.4 % — ABNORMAL LOW (ref 35.0–45.0)
Hemoglobin: 11 g/dL — ABNORMAL LOW (ref 11.7–15.5)
Lymphs Abs: 2508 cells/uL (ref 850–3900)
MCH: 25.7 pg — ABNORMAL LOW (ref 27.0–33.0)
MCHC: 32 g/dL (ref 32.0–36.0)
MCV: 80.4 fL (ref 80.0–100.0)
MPV: 9.5 fL (ref 7.5–12.5)
Monocytes Relative: 6.4 %
Neutro Abs: 4370 cells/uL (ref 1500–7800)
Neutrophils Relative %: 57.5 %
Platelets: 329 10*3/uL (ref 140–400)
RBC: 4.28 10*6/uL (ref 3.80–5.10)
RDW: 15.6 % — ABNORMAL HIGH (ref 11.0–15.0)
Total Lymphocyte: 33 %
WBC: 7.6 10*3/uL (ref 3.8–10.8)

## 2019-09-13 LAB — TSH: TSH: 5.45 mIU/L — ABNORMAL HIGH

## 2019-09-13 LAB — COMPLETE METABOLIC PANEL WITH GFR
AG Ratio: 1.4 (calc) (ref 1.0–2.5)
ALT: 17 U/L (ref 6–29)
AST: 19 U/L (ref 10–35)
Albumin: 4.2 g/dL (ref 3.6–5.1)
Alkaline phosphatase (APISO): 77 U/L (ref 37–153)
BUN: 12 mg/dL (ref 7–25)
CO2: 30 mmol/L (ref 20–32)
Calcium: 9.9 mg/dL (ref 8.6–10.4)
Chloride: 102 mmol/L (ref 98–110)
Creat: 0.82 mg/dL (ref 0.50–1.05)
GFR, Est African American: 95 mL/min/{1.73_m2} (ref >=60)
GFR, Est Non African American: 82 mL/min/{1.73_m2} (ref >=60)
Globulin: 3.1 g/dL (calc) (ref 1.9–3.7)
Glucose, Bld: 124 mg/dL — ABNORMAL HIGH (ref 65–99)
Potassium: 3.7 mmol/L (ref 3.5–5.3)
Sodium: 140 mmol/L (ref 135–146)
Total Bilirubin: 0.3 mg/dL (ref 0.2–1.2)
Total Protein: 7.3 g/dL (ref 6.1–8.1)

## 2019-09-13 LAB — LIPID PANEL
Cholesterol: 194 mg/dL (ref <200)
HDL: 104 mg/dL (ref >=50)
LDL Cholesterol (Calc): 76 mg/dL (calc)
Non-HDL Cholesterol (Calc): 90 mg/dL (calc) (ref <130)
Total CHOL/HDL Ratio: 1.9 (calc) (ref <5.0)
Triglycerides: 68 mg/dL (ref <150)

## 2019-09-13 LAB — CULTURE, URINE COMPREHENSIVE
MICRO NUMBER:: 10499796
RESULT:: NO GROWTH
SPECIMEN QUALITY:: ADEQUATE

## 2019-09-13 LAB — PARATHYROID HORMONE, INTACT (NO CA): PTH: 132 pg/mL — ABNORMAL HIGH (ref 14–64)

## 2019-09-13 LAB — VITAMIN D 25 HYDROXY (VIT D DEFICIENCY, FRACTURES): Vit D, 25-Hydroxy: 33 ng/mL (ref 30–100)

## 2019-09-15 ENCOUNTER — Other Ambulatory Visit: Payer: Self-pay | Admitting: Family Medicine

## 2019-09-15 DIAGNOSIS — E890 Postprocedural hypothyroidism: Secondary | ICD-10-CM

## 2019-09-15 DIAGNOSIS — E213 Hyperparathyroidism, unspecified: Secondary | ICD-10-CM

## 2019-09-15 MED ORDER — LEVOTHYROXINE SODIUM 100 MCG PO TABS: 100.0000 ug | ORAL_TABLET | Freq: Every day | ORAL | 1 refills | Status: DC

## 2019-09-19 ENCOUNTER — Telehealth: Payer: Self-pay | Admitting: Family Medicine

## 2019-09-19 NOTE — Telephone Encounter (Signed)
[facility] calling about pts scan. It needs prior auth to be done for 6/1 appt

## 2019-09-20 ENCOUNTER — Other Ambulatory Visit: Payer: Self-pay | Admitting: Family Medicine

## 2019-09-20 DIAGNOSIS — E213 Hyperparathyroidism, unspecified: Secondary | ICD-10-CM

## 2019-09-24 ENCOUNTER — Ambulatory Visit: Payer: 59

## 2019-09-26 ENCOUNTER — Encounter: Payer: Self-pay | Admitting: Family Medicine

## 2019-10-02 ENCOUNTER — Ambulatory Visit
Admission: RE | Admit: 2019-10-02 | Discharge: 2019-10-02 | Disposition: A | Discharge status: Home / Self Care | Payer: 59 | Source: Ambulatory Visit | Attending: Family Medicine | Admitting: Family Medicine

## 2019-10-02 ENCOUNTER — Other Ambulatory Visit: Payer: Self-pay

## 2019-10-02 ENCOUNTER — Ambulatory Visit: Payer: 59

## 2019-10-02 DIAGNOSIS — E213 Hyperparathyroidism, unspecified: Secondary | ICD-10-CM | POA: Insufficient documentation

## 2019-10-03 ENCOUNTER — Encounter: Payer: Self-pay | Admitting: Family Medicine

## 2019-10-03 ENCOUNTER — Other Ambulatory Visit: Payer: Self-pay | Admitting: Family Medicine

## 2019-10-03 DIAGNOSIS — E890 Postprocedural hypothyroidism: Secondary | ICD-10-CM

## 2019-10-07 ENCOUNTER — Other Ambulatory Visit: Payer: Self-pay | Admitting: Family Medicine

## 2019-10-07 DIAGNOSIS — E213 Hyperparathyroidism, unspecified: Secondary | ICD-10-CM

## 2019-10-11 ENCOUNTER — Other Ambulatory Visit: Payer: 59

## 2019-10-29 ENCOUNTER — Other Ambulatory Visit: Payer: Self-pay | Admitting: Family Medicine

## 2019-10-29 DIAGNOSIS — I10 Essential (primary) hypertension: Secondary | ICD-10-CM

## 2019-10-29 DIAGNOSIS — E890 Postprocedural hypothyroidism: Secondary | ICD-10-CM

## 2019-10-30 ENCOUNTER — Ambulatory Visit: Payer: 59 | Admitting: Family Medicine

## 2020-01-16 ENCOUNTER — Other Ambulatory Visit: Payer: Self-pay | Admitting: Family Medicine

## 2020-01-16 DIAGNOSIS — Z1231 Encounter for screening mammogram for malignant neoplasm of breast: Secondary | ICD-10-CM

## 2020-02-04 ENCOUNTER — Ambulatory Visit
Admission: RE | Admit: 2020-02-04 | Discharge: 2020-02-04 | Disposition: A | Discharge status: Home / Self Care | Payer: 59 | Source: Ambulatory Visit | Attending: Family Medicine | Admitting: Family Medicine

## 2020-02-04 ENCOUNTER — Other Ambulatory Visit: Payer: Self-pay

## 2020-02-04 DIAGNOSIS — Z1231 Encounter for screening mammogram for malignant neoplasm of breast: Secondary | ICD-10-CM | POA: Insufficient documentation

## 2020-03-06 ENCOUNTER — Other Ambulatory Visit: Payer: Self-pay | Admitting: Family Medicine

## 2020-03-06 DIAGNOSIS — I10 Essential (primary) hypertension: Secondary | ICD-10-CM

## 2020-03-06 NOTE — Telephone Encounter (Signed)
Requested medications are due for refill today yes  Requested medications are on the active medication list yes  Last refill 8/14  Last visit 07/2019  Future visit scheduled no  Notes to clinic OV note from 07/2019 states return in 3 months

## 2020-03-06 NOTE — Telephone Encounter (Signed)
lvm to schedule pt

## 2020-03-31 ENCOUNTER — Telehealth: Payer: Self-pay

## 2020-03-31 ENCOUNTER — Other Ambulatory Visit: Payer: Self-pay | Admitting: Family Medicine

## 2020-03-31 ENCOUNTER — Other Ambulatory Visit: Payer: Self-pay

## 2020-03-31 DIAGNOSIS — I10 Essential (primary) hypertension: Secondary | ICD-10-CM

## 2020-03-31 MED ORDER — METOPROLOL SUCCINATE ER 50 MG PO TB24: 50.0000 mg | ORAL_TABLET | Freq: Every day | ORAL | 0 refills | Status: DC

## 2020-03-31 NOTE — Telephone Encounter (Signed)
Copied from CRM [id number]. Topic: Quick Communication - Rx Refill/Question >> Mar 31, 2020  8:30 AM Lyn Hollingshead D wrote: PT is completely out of her medications / requesting a 30 day supply in till her appt 05/16/19/ / metoprolol succinate (TOPROL-XL) 50 MG 24 hr tablet [[id number]] please advise

## 2020-05-15 ENCOUNTER — Ambulatory Visit: Payer: 59 | Admitting: Family Medicine

## 2020-06-01 NOTE — Progress Notes (Signed)
Name: Shelia Vasquez   MRN: [id number]    DOB: 06-08-1965   Date:06/03/2020       Progress Note  Subjective  Chief Complaint  Follow up/Medication Refill  HPI   DMII:diagnosed back in Dec 2018 with hgb of6.6%,  7% and today is 7.3 % . She is also obese and has family history of DM, she is on life style modification only,but willing to start medication today. Discussed options and we decided to try Rybelsus , discussed possible side effects. She denies polyphagia, polydipsia or polyuria at this time.Last urine micro negative, on ARB. She also has dyslipidemia and takes atorvastatin.   HTN: she has not been taking medications consistently , she is on Benicar HCTZ, metoprolol and low dose norvasc, she has hypercalcemia and we will stop hctz and increase dose of norvasc from 5 to 10 mg daily , she will monitor for edema   Major DepressionMild: She has a long history of depression - since her teenage years. Symptoms got worse in 2017 when her fatherdied. She has a 28 yo daughter withautism and does not like getting out of the house, she states her daughter is happy doing virtual school.  She tried Duloxetine in the past but stopped on her own. She states she is doing okay and does not want medications. Working from home is helpful. No longer taking hydroxyzine for anxiety   Inflammatory arthritis: seen by Rheumatologistshe was on Plaquenil but stopped on her own because it was not helping her, explained there are other therapies she will schedule follow up with Dr. Allena Katz.   She states left ankle has been swollen for past few months, daily pain, aching like 7/10  , she states hands gets stiff at times, no back pain lately .   Hypothyroidism:she had level Dec by Dr. Gershon Crane she was compliant with medication at the time, and TSH was high, currently taking 112 mcg daily and we will recheck level   GERD: taking Tums  prn, states occasionally has dysphagia, food gets stuck on upper  esophagus, she states she has been chewing food more and is not having problems recently. Still advised evaluation by GI but she wants to hold off   Chronic constipation: stopped Linzess because of cost, she states currently doing okay, bowel movements daily and no straining   GAD: doing well on medication    Patient Active Problem List   Diagnosis Date Noted  . Undifferentiated inflammatory arthritis (HCC) 08/09/2017  . Positive ANA (antinuclear antibody) 07/26/2017  . Diet-controlled type 2 diabetes mellitus (HCC) 07/12/2017  . Chronic midline low back pain without sciatica 08/24/2016  . Sedimentation rate elevation 07/18/2016  . Elevated C-reactive protein (CRP) 12/09/2015  . Hyperglycemia 12/09/2015  . Benign hypertension 02/06/2015  . Dyslipidemia 02/06/2015  . Insomnia 02/06/2015  . Gastro-esophageal reflux disease without esophagitis 02/06/2015  . History of radioactive iodine thyroid ablation 02/06/2015  . Hypothyroidism, postablative 02/06/2015  . History of iron deficiency anemia 02/06/2015  . Obesity (BMI 30.0-34.9) 02/06/2015  . H/O: hysterectomy 02/06/2015  . Vitamin D deficiency 02/06/2015  . Seasonal allergic rhinitis 02/06/2015  . Chronic constipation 02/06/2015  . History of uterine fibroid 05/29/2013    Past Surgical History:  Procedure Laterality Date  . ABDOMINAL HYSTERECTOMY    . BTL    . CESAREAN SECTION    . COLONOSCOPY WITH PROPOFOL N/A 03/07/2017   Procedure: COLONOSCOPY WITH PROPOFOL;  Surgeon: Midge Minium, MD;  Location: [facility] ENDOSCOPY;  Service: Endoscopy;  Laterality: N/A;  .  TUBAL LIGATION      Family History  Problem Relation Age of Onset  . Arthritis Father   . Autism Daughter   . Breast cancer Neg Hx     Social History   Tobacco Use  . Smoking status: Never Smoker  . Smokeless tobacco: Never Used  Substance Use Topics  . Alcohol use: No    Alcohol/week: 0.0 standard drinks     Current Outpatient Medications:  .  amLODipine  (NORVASC) 5 MG tablet, Take 5 mg by mouth daily., Disp: , Rfl:  .  aspirin EC 81 MG tablet, Take 1 tablet by mouth daily., Disp: , Rfl:  .  atorvastatin (LIPITOR) 10 MG tablet, TAKE 1 TABLET BY MOUTH EVERY DAY, Disp: 90 tablet, Rfl: 1 .  cholecalciferol (VITAMIN D3) 25 MCG (1000 UT) tablet, Take 1,000 Units by mouth daily., Disp: , Rfl:  .  fluticasone (FLONASE) 50 MCG/ACT nasal spray, Place 2 sprays into both nostrils daily., Disp: 16 g, Rfl: 6 .  hydrOXYzine (ATARAX/VISTARIL) 10 MG tablet, TAKE 1 TABLET BY MOUTH TWICE A DAY, Disp: 180 tablet, Rfl: 1 .  ibuprofen (ADVIL,MOTRIN) 600 MG tablet, Take by mouth., Disp: , Rfl:  .  levothyroxine (SYNTHROID) 100 MCG tablet, Take 1 tablet (100 mcg total) by mouth daily. And twice a week take one and a half pill, Disp: 34 tablet, Rfl: 1 .  levothyroxine (SYNTHROID) 112 MCG tablet, PLEASE SEE ATTACHED FOR DETAILED DIRECTIONS, Disp: , Rfl:  .  metoprolol succinate (TOPROL-XL) 50 MG 24 hr tablet, Take 1 tablet (50 mg total) by mouth daily. Take with or immediately following a meal., Disp: 90 tablet, Rfl: 0 .  olmesartan-hydrochlorothiazide (BENICAR HCT) 40-25 MG tablet, Take 1 tablet by mouth daily., Disp: 90 tablet, Rfl: 0 .  ferrous sulfate 325 (65 FE) MG tablet, Take 325 mg by mouth daily. (Patient not taking: Reported on 06/03/2020), Disp: , Rfl:  .  linaclotide (LINZESS) 145 MCG CAPS capsule, Take 1 capsule (145 mcg total) by mouth daily. (Patient not taking: Reported on 06/03/2020), Disp: 90 capsule, Rfl: 0 .  loratadine (CLARITIN REDITABS) 10 MG dissolvable tablet, Take 1 tablet (10 mg total) by mouth daily. (Patient not taking: Reported on 06/03/2020), Disp: 30 tablet, Rfl: 2 .  Vitamin D, Ergocalciferol, (DRISDOL) 1.25 MG (50000 UNIT) CAPS capsule, TAKE 1 CAPSULE (50,000 UNITS TOTAL) BY MOUTH EVERY 7 (SEVEN) DAYS. (Patient not taking: Reported on 06/03/2020), Disp: 12 capsule, Rfl: 0  No Known Allergies  I personally reviewed active problem list, medication  list, allergies, family history, social history, health maintenance, notes from last encounter with the patient/caregiver today.   ROS  Constitutional: Negative for fever or weight change.  Respiratory: Negative for cough and shortness of breath.   Cardiovascular: Negative for chest pain or palpitations.  Gastrointestinal: Negative for abdominal pain, no bowel changes.  Musculoskeletal: Negative for gait problem she has left ankle  joint swelling.  Skin: Negative for rash.  Neurological: Negative for dizziness or headache.  No other specific complaints in a complete review of systems (except as listed in HPI above).  Objective  Vitals:   06/03/20 1053  BP: 140/90  Pulse: 84  Resp: 16  Temp: 98.4 F (36.9 C)  TempSrc: Oral  SpO2: 99%  Weight: 208 lb 14.4 oz (94.8 kg)  Height: 5\' 4"  (1.626 m)    Body mass index is 35.86 kg/m.  Physical Exam  Constitutional: Patient appears well-developed and well-nourished. Obese  No distress.  HEENT: head atraumatic, normocephalic,  pupils equal and reactive to light,  neck supple, throat within normal limits Cardiovascular: Normal rate, regular rhythm and normal heart sounds.  No murmur heard. Trace  BLE edema. Left worse than right but has inflammatory arthritis and off plaquenil  Pulmonary/Chest: Effort normal and breath sounds normal. No respiratory distress. Abdominal: Soft.  There is no tenderness. Muscular Skeletal: swollen and tender left ankle, no redness or increase in warmth, pain with rom  Psychiatric: Patient has a normal mood and affect. behavior is normal. Judgment and thought content normal.  Recent Results (from the past 2160 hour(s))  POCT HgB A1C     Status: Abnormal   Collection Time: 06/03/20 10:56 AM  Result Value Ref Range   Hemoglobin A1C 7.3 (A) 4.0 - 5.6 %   HbA1c POC (<> result, manual entry)     HbA1c, POC (prediabetic range)     HbA1c, POC (controlled diabetic range)       PHQ2/9: Depression screen [facility]  2/9 06/03/2020 01/03/2019 09/28/2018 06/19/2018 02/16/2018  Decreased Interest 0 0 2 1 0  Down, Depressed, Hopeless 0 0 0 0 0  PHQ - 2 Score 0 0 2 1 0  Altered sleeping - 0 0 1 1  Tired, decreased energy - 0 0 1 3  Change in appetite - 1 0 3 0  Feeling bad or failure about yourself  - 0 0 0 0  Trouble concentrating - 0 0 0 0  Moving slowly or fidgety/restless - 0 0 0 0  Suicidal thoughts - 0 0 0 0  PHQ-9 Score - 1 2 6 4   Difficult doing work/chores - Not difficult at all Not difficult at all Not difficult at all Not difficult at all    phq 9 is negative   Fall Risk: Fall Risk  06/03/2020 01/03/2019 09/28/2018 06/19/2018 02/16/2018  Falls in the past year? 0 0 0 0 No  Number falls in past yr: 0 0 0 - -  Injury with Fall? 0 0 0 - -    Functional Status Survey: Is the patient deaf or have difficulty hearing?: No Does the patient have difficulty seeing, even when wearing glasses/contacts?: Yes Does the patient have difficulty concentrating, remembering, or making decisions?: No Does the patient have difficulty walking or climbing stairs?: No Does the patient have difficulty dressing or bathing?: No Does the patient have difficulty doing errands alone such as visiting a doctor's office or shopping?: No    Assessment & Plan  1. Diabetes mellitus type 2 in obese (HCC)  - POCT HgB A1C - Lipid panel - Semaglutide (RYBELSUS) 7 MG TABS; Take 1 tablet by mouth daily.  Dispense: 30 tablet; Refill: 2  2. Hyperparathyroidism (HCC)  Seeing Dr. Gershon Crane, we will stop HCTZ and adjust norvasc dose   3. Hypothyroidism   - TSH  4. Inflammatory polyarthritis (HCC)  She will contact Dr. Allena Katz for follow up  5. Morbid obesity (HCC)  Discussed with the patient the risk posed by an increased BMI. Discussed importance of portion control, calorie counting and at least 150 minutes of physical activity weekly. Avoid sweet beverages and drink more water. Eat at least 6 servings of fruit and vegetables  daily   6. Major depression in remission [facility])  Doing well  7. Dyslipidemia  - atorvastatin (LIPITOR) 10 MG tablet; Take 1 tablet (10 mg total) by mouth daily.  Dispense: 90 tablet; Refill: 1  8. Gastro-esophageal reflux disease without esophagitis   9. Vitamin D deficiency  - Vitamin  D, Ergocalciferol, (DRISDOL) 1.25 MG (50000 UNIT) CAPS capsule; Take 1 capsule (50,000 Units total) by mouth every 7 (seven) days.  Dispense: 12 capsule; Refill: 1  10. Benign hypertension  - CBC with Differential/Platelet - COMPLETE METABOLIC PANEL WITH GFR - amLODipine (NORVASC) 10 MG tablet; Take 1 tablet (10 mg total) by mouth daily.  Dispense: 90 tablet; Refill: 0 - metoprolol succinate (TOPROL-XL) 50 MG 24 hr tablet; Take 1 tablet (50 mg total) by mouth daily. Take with or immediately following a meal.  Dispense: 90 tablet; Refill: 1  11. Anemia of chronic disease  - CBC with Differential/Platelet  12. Need for hepatitis C screening test  - Hepatitis C Antibody  13. Perennial allergic rhinitis with seasonal variation  - fluticasone (FLONASE) 50 MCG/ACT nasal spray; Place 2 sprays into both nostrils daily.  Dispense: 16 g; Refill: 5

## 2020-06-03 ENCOUNTER — Ambulatory Visit (INDEPENDENT_AMBULATORY_CARE_PROVIDER_SITE_OTHER): Payer: 59 | Admitting: Family Medicine

## 2020-06-03 ENCOUNTER — Other Ambulatory Visit: Payer: Self-pay

## 2020-06-03 ENCOUNTER — Encounter: Payer: Self-pay | Admitting: Family Medicine

## 2020-06-03 VITALS — BP 140/90 | HR 84 | Temp 98.4°F | Resp 16 | Ht 64.0 in | Wt 208.9 lb

## 2020-06-03 DIAGNOSIS — D638 Anemia in other chronic diseases classified elsewhere: Secondary | ICD-10-CM

## 2020-06-03 DIAGNOSIS — Z23 Encounter for immunization: Secondary | ICD-10-CM

## 2020-06-03 DIAGNOSIS — E1169 Type 2 diabetes mellitus with other specified complication: Secondary | ICD-10-CM | POA: Diagnosis not present

## 2020-06-03 DIAGNOSIS — E213 Hyperparathyroidism, unspecified: Secondary | ICD-10-CM

## 2020-06-03 DIAGNOSIS — E669 Obesity, unspecified: Secondary | ICD-10-CM

## 2020-06-03 DIAGNOSIS — E559 Vitamin D deficiency, unspecified: Secondary | ICD-10-CM

## 2020-06-03 DIAGNOSIS — M064 Inflammatory polyarthropathy: Secondary | ICD-10-CM

## 2020-06-03 DIAGNOSIS — E890 Postprocedural hypothyroidism: Secondary | ICD-10-CM | POA: Diagnosis not present

## 2020-06-03 DIAGNOSIS — E785 Hyperlipidemia, unspecified: Secondary | ICD-10-CM

## 2020-06-03 DIAGNOSIS — Z1159 Encounter for screening for other viral diseases: Secondary | ICD-10-CM

## 2020-06-03 DIAGNOSIS — I10 Essential (primary) hypertension: Secondary | ICD-10-CM

## 2020-06-03 DIAGNOSIS — J302 Other seasonal allergic rhinitis: Secondary | ICD-10-CM

## 2020-06-03 DIAGNOSIS — J3089 Other allergic rhinitis: Secondary | ICD-10-CM

## 2020-06-03 DIAGNOSIS — F325 Major depressive disorder, single episode, in full remission: Secondary | ICD-10-CM

## 2020-06-03 DIAGNOSIS — K219 Gastro-esophageal reflux disease without esophagitis: Secondary | ICD-10-CM

## 2020-06-03 LAB — POCT GLYCOSYLATED HEMOGLOBIN (HGB A1C): Hemoglobin A1C: 7.3 % — AB (ref 4.0–5.6)

## 2020-06-03 MED ORDER — METOPROLOL SUCCINATE ER 50 MG PO TB24: 50.0000 mg | ORAL_TABLET | Freq: Every day | ORAL | 1 refills | Status: DC

## 2020-06-03 MED ORDER — AMLODIPINE BESYLATE 10 MG PO TABS: 10.0000 mg | ORAL_TABLET | Freq: Every day | ORAL | 0 refills | Status: DC

## 2020-06-03 MED ORDER — ATORVASTATIN CALCIUM 10 MG PO TABS: 10.0000 mg | ORAL_TABLET | Freq: Every day | ORAL | 1 refills | Status: DC

## 2020-06-03 MED ORDER — VITAMIN D (ERGOCALCIFEROL) 1.25 MG (50000 UNIT) PO CAPS: 50000.0000 [IU] | ORAL_CAPSULE | ORAL | 1 refills | Status: DC

## 2020-06-03 MED ORDER — OLMESARTAN MEDOXOMIL 40 MG PO TABS: 40.0000 mg | ORAL_TABLET | Freq: Every day | ORAL | 1 refills | Status: DC

## 2020-06-03 MED ORDER — FLUTICASONE PROPIONATE 50 MCG/ACT NA SUSP: 2.0000 | Freq: Every day | NASAL | 5 refills | Status: DC

## 2020-06-03 MED ORDER — RYBELSUS 7 MG PO TABS: 1.0000 | ORAL_TABLET | Freq: Every day | ORAL | 2 refills | Status: DC

## 2020-06-04 LAB — CBC WITH DIFFERENTIAL/PLATELET
Absolute Monocytes: 614 cells/uL (ref 200–950)
Basophils Absolute: 59 cells/uL (ref 0–200)
Basophils Relative: 0.8 %
Eosinophils Absolute: 192 cells/uL (ref 15–500)
Eosinophils Relative: 2.6 %
HCT: 32.6 % — ABNORMAL LOW (ref 35.0–45.0)
Hemoglobin: 10.5 g/dL — ABNORMAL LOW (ref 11.7–15.5)
Lymphs Abs: 2627 cells/uL (ref 850–3900)
MCH: 25.5 pg — ABNORMAL LOW (ref 27.0–33.0)
MCHC: 32.2 g/dL (ref 32.0–36.0)
MCV: 79.1 fL — ABNORMAL LOW (ref 80.0–100.0)
MPV: 10.1 fL (ref 7.5–12.5)
Monocytes Relative: 8.3 %
Neutro Abs: 3907 cells/uL (ref 1500–7800)
Neutrophils Relative %: 52.8 %
Platelets: 363 10*3/uL (ref 140–400)
RBC: 4.12 10*6/uL (ref 3.80–5.10)
RDW: 14.4 % (ref 11.0–15.0)
Total Lymphocyte: 35.5 %
WBC: 7.4 10*3/uL (ref 3.8–10.8)

## 2020-06-04 LAB — TSH: TSH: 2.45 mIU/L

## 2020-06-04 LAB — COMPLETE METABOLIC PANEL WITH GFR
AG Ratio: 1.4 (calc) (ref 1.0–2.5)
ALT: 11 U/L (ref 6–29)
AST: 15 U/L (ref 10–35)
Albumin: 4.3 g/dL (ref 3.6–5.1)
Alkaline phosphatase (APISO): 91 U/L (ref 37–153)
BUN: 16 mg/dL (ref 7–25)
CO2: 29 mmol/L (ref 20–32)
Calcium: 9.7 mg/dL (ref 8.6–10.4)
Chloride: 104 mmol/L (ref 98–110)
Creat: 0.69 mg/dL (ref 0.50–1.05)
GFR, Est African American: 115 mL/min/{1.73_m2} (ref >=60)
GFR, Est Non African American: 99 mL/min/{1.73_m2} (ref >=60)
Globulin: 3.1 g/dL (calc) (ref 1.9–3.7)
Glucose, Bld: 105 mg/dL — ABNORMAL HIGH (ref 65–99)
Potassium: 3.9 mmol/L (ref 3.5–5.3)
Sodium: 141 mmol/L (ref 135–146)
Total Bilirubin: 0.3 mg/dL (ref 0.2–1.2)
Total Protein: 7.4 g/dL (ref 6.1–8.1)

## 2020-06-04 LAB — LIPID PANEL
Cholesterol: 171 mg/dL (ref <200)
HDL: 93 mg/dL (ref >=50)
LDL Cholesterol (Calc): 64 mg/dL (calc)
Non-HDL Cholesterol (Calc): 78 mg/dL (calc) (ref <130)
Total CHOL/HDL Ratio: 1.8 (calc) (ref <5.0)
Triglycerides: 60 mg/dL (ref <150)

## 2020-06-04 LAB — HEPATITIS C ANTIBODY
Hepatitis C Ab: NONREACTIVE
SIGNAL TO CUT-OFF: 0.01 (ref <1.00)

## 2020-08-28 ENCOUNTER — Other Ambulatory Visit: Payer: Self-pay | Admitting: Family Medicine

## 2020-08-28 DIAGNOSIS — I10 Essential (primary) hypertension: Secondary | ICD-10-CM

## 2020-08-28 NOTE — Progress Notes (Signed)
Name: Shelia Vasquez   MRN: [id number]    DOB: March 04, 1966   Date:08/31/2020       Progress Note  Subjective  Chief Complaint  Follow Up  HPI  DMII:diagnosed back in Dec 2018 with hgb of6.6%,  7% , 7.3 % and now 7.5 % . She is also obese and has family history of DM, she is on life style modification only,we started her on Rybelsus on her last visit but still on 7 mg dose, she has been feeling bloated on medication but symptoms are mild, afraid of taking Metformin but willing to go up on Rybelsus.  She denies polyphagia, polydipsia or polyuria at this time.Last urine micro negative, on ARB. She also has dyslipidemia and takes atorvastatin. Reviewed last labs, we will go up from 7 mg to 14 mg in am and recheck level in 3 months   HTN: she has not been taking medications consistently , she is on Benicar , metoprolol and low dose norvasc, she has a history of hypercalcemia and we stopped HCTZ and increased dose of Norvasc March 2022 , swelling on ankles is a little worse but not severe . Discussed compression stocking hoses   Major DepressionMild/Anxiety : She has a long history of depression - since her teenage years. Symptoms got worse in 2017 when her fatherdied. She has a 54 yo daughter withautism and does not like getting out of the house, she states her daughter is still doing virtual school.She tried Duloxetine in the past but stopped on her own, she also stopped hydroxyzine. She states she is doing okay and does not want medications. Working from home is helpful  Inflammatory arthritis: seen by Rheumatologistshe is currently Plaquenil and also on Methotrexate since April 2022, she is aware of the importance of yearly eye exam.   She states pain is still present , worse on ankles , present on both ankles. She states pain is still present and intense, she is hoping medication will start working soon   Hypothyroidism:she had level Dec by Dr. Gershon Crane she was compliant with  medication at the time, currently taking 112 mcg daily and last TSH was at goal   GERD: taking Tums  prn, states occasionally has dysphagia, food gets stuck on upper esophagus, she states she has noticed some symptoms because she has not been chewing her food as much and sometimes eating before bed. She will resume life style modification   Chronic constipation: stopped Linzess because of cost, she states currently doing okay, bowel movements daily and no straining    Patient Active Problem List   Diagnosis Date Noted  . Undifferentiated inflammatory arthritis (HCC) 08/09/2017  . Positive ANA (antinuclear antibody) 07/26/2017  . Diet-controlled type 2 diabetes mellitus (HCC) 07/12/2017  . Chronic midline low back pain without sciatica 08/24/2016  . Sedimentation rate elevation 07/18/2016  . Elevated C-reactive protein (CRP) 12/09/2015  . Hyperglycemia 12/09/2015  . Benign hypertension 02/06/2015  . Dyslipidemia 02/06/2015  . Insomnia 02/06/2015  . Gastro-esophageal reflux disease without esophagitis 02/06/2015  . History of radioactive iodine thyroid ablation 02/06/2015  . Hypothyroidism, postablative 02/06/2015  . History of iron deficiency anemia 02/06/2015  . Obesity (BMI 30.0-34.9) 02/06/2015  . H/O: hysterectomy 02/06/2015  . Vitamin D deficiency 02/06/2015  . Seasonal allergic rhinitis 02/06/2015  . Chronic constipation 02/06/2015  . History of uterine fibroid 05/29/2013    Past Surgical History:  Procedure Laterality Date  . ABDOMINAL HYSTERECTOMY    . BTL    . CESAREAN  SECTION    . COLONOSCOPY WITH PROPOFOL N/A 03/07/2017   Procedure: COLONOSCOPY WITH PROPOFOL;  Surgeon: Midge Minium, MD;  Location: [facility] ENDOSCOPY;  Service: Endoscopy;  Laterality: N/A;  . TUBAL LIGATION      Family History  Problem Relation Age of Onset  . Arthritis Father   . Autism Daughter   . Breast cancer Neg Hx     Social History   Tobacco Use  . Smoking status: Never Smoker  .  Smokeless tobacco: Never Used  Substance Use Topics  . Alcohol use: No    Alcohol/week: 0.0 standard drinks     Current Outpatient Medications:  .  amLODipine (NORVASC) 10 MG tablet, TAKE 1 TABLET BY MOUTH EVERY DAY, Disp: 90 tablet, Rfl: 0 .  atorvastatin (LIPITOR) 10 MG tablet, Take 1 tablet (10 mg total) by mouth daily., Disp: 90 tablet, Rfl: 1 .  cholecalciferol (VITAMIN D3) 25 MCG (1000 UT) tablet, Take 1,000 Units by mouth daily., Disp: , Rfl:  .  fluticasone (FLONASE) 50 MCG/ACT nasal spray, Place 2 sprays into both nostrils daily., Disp: 16 g, Rfl: 5 .  levothyroxine (SYNTHROID) 112 MCG tablet, Take 1 tablet by mouth daily., Disp: , Rfl:  .  loratadine (CLARITIN REDITABS) 10 MG dissolvable tablet, Take 1 tablet (10 mg total) by mouth daily., Disp: 30 tablet, Rfl: 2 .  metoprolol succinate (TOPROL-XL) 50 MG 24 hr tablet, Take 1 tablet (50 mg total) by mouth daily. Take with or immediately following a meal., Disp: 90 tablet, Rfl: 1 .  olmesartan (BENICAR) 40 MG tablet, Take 1 tablet (40 mg total) by mouth daily., Disp: 90 tablet, Rfl: 1 .  Semaglutide (RYBELSUS) 7 MG TABS, Take 1 tablet by mouth daily., Disp: 30 tablet, Rfl: 2 .  Vitamin D, Ergocalciferol, (DRISDOL) 1.25 MG (50000 UNIT) CAPS capsule, Take 1 capsule (50,000 Units total) by mouth every 7 (seven) days., Disp: 12 capsule, Rfl: 1  No Known Allergies  I personally reviewed active problem list, medication list, allergies, family history, social history, health maintenance with the patient/caregiver today.   ROS  Constitutional: Negative for fever or weight change.  Respiratory: Negative for cough and shortness of breath.   Cardiovascular: Negative for chest pain or palpitations.  Gastrointestinal: Negative for abdominal pain, no bowel changes.  Musculoskeletal: Negative for gait problem or joint swelling.  Skin: Negative for rash.  Neurological: Negative for dizziness or headache.  No other specific complaints in a  complete review of systems (except as listed in HPI above).  Objective  Vitals:   08/31/20 1024  BP: 132/88  Pulse: 80  Resp: 16  Temp: 98.2 F (36.8 C)  TempSrc: Oral  SpO2: 96%  Weight: 210 lb (95.3 kg)  Height: 5\' 4"  (1.626 m)    Body mass index is 36.05 kg/m.  Physical Exam  Constitutional: Negative for fever or weight change.  Respiratory: Negative for cough and shortness of breath.   Cardiovascular: Negative for chest pain or palpitations.  Gastrointestinal: Negative for abdominal pain, no bowel changes.  Musculoskeletal: Negative for gait problem , positive for bilaterally ankle joint swelling.  Skin: Negative for rash.  Neurological: Negative for dizziness or headache.  No other specific complaints in a complete review of systems (except as listed in HPI above).  Recent Results (from the past 2160 hour(s))  POCT HgB A1C     Status: Abnormal   Collection Time: 06/03/20 10:56 AM  Result Value Ref Range   Hemoglobin A1C 7.3 (A) 4.0 - 5.6 %  HbA1c POC (<> result, manual entry)     HbA1c, POC (prediabetic range)     HbA1c, POC (controlled diabetic range)    Lipid panel     Status: None   Collection Time: 06/03/20 11:43 AM  Result Value Ref Range   Cholesterol 171 <200 mg/dL   HDL 93 > OR = 50 mg/dL   Triglycerides 60 <004 mg/dL   LDL Cholesterol (Calc) 64 mg/dL (calc)    Comment: Reference range: <100 . Desirable range <100 mg/dL for primary prevention;   <70 mg/dL for patients with CHD or diabetic patients  with > or = 2 CHD risk factors. Marland Kitchen LDL-C is now calculated using the Martin-Hopkins  calculation, which is a validated novel method providing  better accuracy than the Friedewald equation in the  estimation of LDL-C.  Horald Pollen et al. Lenox Ahr. 5997;741(42): 2061-2068  (http://education.[facility].com/faq/FAQ164)    Total CHOL/HDL Ratio 1.8 <5.0 (calc)   Non-HDL Cholesterol (Calc) 78 <395 mg/dL (calc)    Comment: For patients with diabetes plus 1  major ASCVD risk  factor, treating to a non-HDL-C goal of <100 mg/dL  (LDL-C of <32 mg/dL) is considered a therapeutic  option.   CBC with Differential/Platelet     Status: Abnormal   Collection Time: 06/03/20 11:43 AM  Result Value Ref Range   WBC 7.4 3.8 - 10.8 Thousand/uL   RBC 4.12 3.80 - 5.10 Million/uL   Hemoglobin 10.5 (L) 11.7 - 15.5 g/dL   HCT 02.3 (L) 34.3 - 56.8 %   MCV 79.1 (L) 80.0 - 100.0 fL   MCH 25.5 (L) 27.0 - 33.0 pg   MCHC 32.2 32.0 - 36.0 g/dL   RDW 61.6 83.7 - 29.0 %   Platelets 363 140 - 400 Thousand/uL   MPV 10.1 7.5 - 12.5 fL   Neutro Abs 3,907 1,500 - 7,800 cells/uL   Lymphs Abs 2,627 850 - 3,900 cells/uL   Absolute Monocytes 614 200 - 950 cells/uL   Eosinophils Absolute 192 15 - 500 cells/uL   Basophils Absolute 59 0 - 200 cells/uL   Neutrophils Relative % 52.8 %   Total Lymphocyte 35.5 %   Monocytes Relative 8.3 %   Eosinophils Relative 2.6 %   Basophils Relative 0.8 %  COMPLETE METABOLIC PANEL WITH GFR     Status: Abnormal   Collection Time: 06/03/20 11:43 AM  Result Value Ref Range   Glucose, Bld 105 (H) 65 - 99 mg/dL    Comment: .            Fasting reference interval . For someone without known diabetes, a glucose value between 100 and 125 mg/dL is consistent with prediabetes and should be confirmed with a follow-up test. .    BUN 16 7 - 25 mg/dL   Creat 2.11 1.55 - 2.08 mg/dL    Comment: For patients >45 years of age, the reference limit for Creatinine is approximately 13% higher for people identified as African-American. .    GFR, Est Non African American 99 > OR = 60 mL/min/1.23m2   GFR, Est African American 115 > OR = 60 mL/min/1.12m2   BUN/Creatinine Ratio NOT APPLICABLE 6 - 22 (calc)   Sodium 141 135 - 146 mmol/L   Potassium 3.9 3.5 - 5.3 mmol/L   Chloride 104 98 - 110 mmol/L   CO2 29 20 - 32 mmol/L   Calcium 9.7 8.6 - 10.4 mg/dL   Total Protein 7.4 6.1 - 8.1 g/dL   Albumin 4.3 3.6 - 5.1  g/dL   Globulin 3.1 1.9 - 3.7 g/dL  (calc)   AG Ratio 1.4 1.0 - 2.5 (calc)   Total Bilirubin 0.3 0.2 - 1.2 mg/dL   Alkaline phosphatase (APISO) 91 37 - 153 U/L   AST 15 10 - 35 U/L   ALT 11 6 - 29 U/L  TSH     Status: None   Collection Time: 06/03/20 11:43 AM  Result Value Ref Range   TSH 2.45 mIU/L    Comment:           Reference Range .           > or = 20 Years  0.40-4.50 .                Pregnancy Ranges           First trimester    0.26-2.66           Second trimester   0.55-2.73           Third trimester    0.43-2.91   Hepatitis C antibody     Status: None   Collection Time: 06/03/20 11:43 AM  Result Value Ref Range   Hepatitis C Ab NON-REACTIVE NON-REACTI   SIGNAL TO CUT-OFF 0.01 <1.00    Comment: . HCV antibody was non-reactive. There is no laboratory  evidence of HCV infection. . In most cases, no further action is required. However, if recent HCV exposure is suspected, a test for HCV RNA (test code 55732) is suggested. . For additional information please refer to http://education.[facility].com/faq/FAQ22v1 (This link is being provided for informational/ educational purposes only.) .   POCT HgB A1C     Status: Abnormal   Collection Time: 08/31/20 10:27 AM  Result Value Ref Range   Hemoglobin A1C 7.5 (A) 4.0 - 5.6 %   HbA1c POC (<> result, manual entry)     HbA1c, POC (prediabetic range)     HbA1c, POC (controlled diabetic range)        PHQ2/9: Depression screen [facility] 2/9 08/31/2020 06/03/2020 01/03/2019 09/28/2018 06/19/2018  Decreased Interest 0 0 0 2 1  Down, Depressed, Hopeless 0 0 0 0 0  PHQ - 2 Score 0 0 0 2 1  Altered sleeping 0 - 0 0 1  Tired, decreased energy 0 - 0 0 1  Change in appetite 0 - 1 0 3  Feeling bad or failure about yourself  0 - 0 0 0  Trouble concentrating 0 - 0 0 0  Moving slowly or fidgety/restless 0 - 0 0 0  Suicidal thoughts 0 - 0 0 0  PHQ-9 Score 0 - 1 2 6   Difficult doing work/chores - - Not difficult at all Not difficult at all Not difficult at all    phq  9 is negative   Fall Risk: Fall Risk  08/31/2020 06/03/2020 01/03/2019 09/28/2018 06/19/2018  Falls in the past year? 0 0 0 0 0  Number falls in past yr: 0 0 0 0 -  Injury with Fall? 0 0 0 0 -      Functional Status Survey: Is the patient deaf or have difficulty hearing?: No Does the patient have difficulty seeing, even when wearing glasses/contacts?: Yes Does the patient have difficulty concentrating, remembering, or making decisions?: No Does the patient have difficulty walking or climbing stairs?: No Does the patient have difficulty dressing or bathing?: No Does the patient have difficulty doing errands alone such as visiting a doctor's office or shopping?: No  Assessment & Plan  1. Diabetes mellitus type 2 in obese (HCC)  - POCT HgB A1C - Semaglutide (RYBELSUS) 14 MG TABS; Take 14 mg by mouth daily.  Dispense: 90 tablet; Refill: 0  2. Hyperparathyroidism (HCC)   3. Hypothyroidism, postablative   4. Morbid obesity (HCC)  Discussed with the patient the risk posed by an increased BMI. Discussed importance of portion control, calorie counting and at least 150 minutes of physical activity weekly. Avoid sweet beverages and drink more water. Eat at least 6 servings of fruit and vegetables daily   5. Major depression in remission (HCC)  Off medication and is doing well   6. Gastro-esophageal reflux disease without esophagitis  Resume life style modification for GERD symptoms   7. Dyslipidemia  Continue Atorvastatin  8. Inflammatory polyarthritis (HCC)  Needs to take correct dose, reviewed with patient today  9. Vitamin D deficiency   10. Anemia of chronic disease   11. Benign hypertension  At goal  12. GAD (generalized anxiety disorder)

## 2020-08-31 ENCOUNTER — Encounter: Payer: Self-pay | Admitting: Family Medicine

## 2020-08-31 ENCOUNTER — Other Ambulatory Visit: Payer: Self-pay

## 2020-08-31 ENCOUNTER — Ambulatory Visit (INDEPENDENT_AMBULATORY_CARE_PROVIDER_SITE_OTHER): Payer: 59 | Admitting: Family Medicine

## 2020-08-31 VITALS — BP 132/88 | HR 80 | Temp 98.2°F | Resp 16 | Ht 64.0 in | Wt 210.0 lb

## 2020-08-31 DIAGNOSIS — E559 Vitamin D deficiency, unspecified: Secondary | ICD-10-CM

## 2020-08-31 DIAGNOSIS — E890 Postprocedural hypothyroidism: Secondary | ICD-10-CM

## 2020-08-31 DIAGNOSIS — E119 Type 2 diabetes mellitus without complications: Secondary | ICD-10-CM

## 2020-08-31 DIAGNOSIS — E213 Hyperparathyroidism, unspecified: Secondary | ICD-10-CM

## 2020-08-31 DIAGNOSIS — E669 Obesity, unspecified: Secondary | ICD-10-CM | POA: Diagnosis not present

## 2020-08-31 DIAGNOSIS — D638 Anemia in other chronic diseases classified elsewhere: Secondary | ICD-10-CM

## 2020-08-31 DIAGNOSIS — E1169 Type 2 diabetes mellitus with other specified complication: Secondary | ICD-10-CM

## 2020-08-31 DIAGNOSIS — F325 Major depressive disorder, single episode, in full remission: Secondary | ICD-10-CM

## 2020-08-31 DIAGNOSIS — M064 Inflammatory polyarthropathy: Secondary | ICD-10-CM

## 2020-08-31 DIAGNOSIS — I10 Essential (primary) hypertension: Secondary | ICD-10-CM

## 2020-08-31 DIAGNOSIS — K219 Gastro-esophageal reflux disease without esophagitis: Secondary | ICD-10-CM

## 2020-08-31 DIAGNOSIS — F411 Generalized anxiety disorder: Secondary | ICD-10-CM

## 2020-08-31 DIAGNOSIS — E785 Hyperlipidemia, unspecified: Secondary | ICD-10-CM

## 2020-08-31 LAB — POCT GLYCOSYLATED HEMOGLOBIN (HGB A1C): Hemoglobin A1C: 7.5 % — AB (ref 4.0–5.6)

## 2020-08-31 MED ORDER — RYBELSUS 14 MG PO TABS: 14.0000 mg | ORAL_TABLET | Freq: Every day | ORAL | 0 refills | Status: DC

## 2020-08-31 NOTE — Patient Instructions (Signed)
Rybelsus dose was moved to 14 mg, if you have 7 mg tablets at home take two at the same time in am's

## 2020-09-16 ENCOUNTER — Encounter: Payer: Self-pay | Admitting: Family Medicine

## 2020-11-03 ENCOUNTER — Other Ambulatory Visit: Payer: Self-pay

## 2020-11-03 ENCOUNTER — Ambulatory Visit (INDEPENDENT_AMBULATORY_CARE_PROVIDER_SITE_OTHER): Payer: 59 | Admitting: Family Medicine

## 2020-11-03 ENCOUNTER — Encounter: Payer: Self-pay | Admitting: Family Medicine

## 2020-11-03 VITALS — BP 122/64 | HR 77 | Temp 98.2°F | Resp 16 | Ht 62.0 in | Wt 206.0 lb

## 2020-11-03 DIAGNOSIS — L0211 Cutaneous abscess of neck: Secondary | ICD-10-CM

## 2020-11-03 MED ORDER — DOXYCYCLINE HYCLATE 100 MG PO TABS: 100.0000 mg | ORAL_TABLET | Freq: Two times a day (BID) | ORAL | 0 refills | Status: AC

## 2020-11-03 NOTE — Progress Notes (Signed)
Patient ID: Shelia Vasquez, female    DOB: Aug 31, 1965, 55 y.o.   MRN: [id number]  PCP: Steele Sizer, MD  Chief Complaint  Patient presents with   Cyst    On neck for 2 years, flared up    Subjective:   Shelia Vasquez is a 55 y.o. female, presents to clinic with CC of the following:  HPI  Pt presents with bump to anterior neck that has been there for years but it recently flared up, got irritated, a little enlarged.  No drainage or past procedure on it. No spreading redness, pain, fever, chills Current pertinent med hx of DM and autoimmune disease on methotrexate but no other immunosuppressive meds No hx of MRSA   Patient Active Problem List   Diagnosis Date Noted   Inflammatory polyarthritis ([place]) 08/31/2020   Positive ANA (antinuclear antibody) 07/26/2017   Diet-controlled type 2 diabetes mellitus ([place]) 07/12/2017   Chronic midline low back pain without sciatica 08/24/2016   Sedimentation rate elevation 07/18/2016   Elevated C-reactive protein (CRP) 12/09/2015   Hyperglycemia 12/09/2015   Benign hypertension 02/06/2015   Dyslipidemia 02/06/2015   Insomnia 02/06/2015   Gastro-esophageal reflux disease without esophagitis 02/06/2015   History of radioactive iodine thyroid ablation 02/06/2015   Hypothyroidism, postablative 02/06/2015   History of iron deficiency anemia 02/06/2015   Obesity (BMI 30.0-34.9) 02/06/2015   H/O: hysterectomy 02/06/2015   Vitamin D deficiency 02/06/2015   Seasonal allergic rhinitis 02/06/2015   Chronic constipation 02/06/2015   History of uterine fibroid 05/29/2013      Current Outpatient Medications:    amLODipine (NORVASC) 10 MG tablet, TAKE 1 TABLET BY MOUTH EVERY DAY, Disp: 90 tablet, Rfl: 0   atorvastatin (LIPITOR) 10 MG tablet, Take 1 tablet (10 mg total) by mouth daily., Disp: 90 tablet, Rfl: 1   fluticasone (FLONASE) 50 MCG/ACT nasal spray, Place 2 sprays into both nostrils daily., Disp: 16 g, Rfl: 5   folic acid  (FOLVITE) 1 MG tablet, Take 1 mg by mouth daily., Disp: , Rfl:    hydroxychloroquine (PLAQUENIL) 200 MG tablet, Take 200 mg by mouth 2 (two) times daily., Disp: , Rfl:    levothyroxine (SYNTHROID) 112 MCG tablet, Take 1 tablet by mouth daily., Disp: , Rfl:    levothyroxine (SYNTHROID) 125 MCG tablet, Take by mouth., Disp: , Rfl:    loratadine (CLARITIN REDITABS) 10 MG dissolvable tablet, Take 1 tablet (10 mg total) by mouth daily., Disp: 30 tablet, Rfl: 2   metFORMIN (GLUCOPHAGE-XR) 500 MG 24 hr tablet, Take 500 mg by mouth 2 (two) times daily., Disp: , Rfl:    methotrexate (RHEUMATREX) 2.5 MG tablet, Take 5 tablets by mouth once a week., Disp: , Rfl:    metoprolol succinate (TOPROL-XL) 50 MG 24 hr tablet, Take 1 tablet (50 mg total) by mouth daily. Take with or immediately following a meal., Disp: 90 tablet, Rfl: 1   olmesartan (BENICAR) 40 MG tablet, Take 1 tablet (40 mg total) by mouth daily., Disp: 90 tablet, Rfl: 1   Semaglutide (RYBELSUS) 14 MG TABS, Take 14 mg by mouth daily., Disp: 90 tablet, Rfl: 0   Vitamin D, Ergocalciferol, (DRISDOL) 1.25 MG (50000 UNIT) CAPS capsule, Take 1 capsule (50,000 Units total) by mouth every 7 (seven) days., Disp: 12 capsule, Rfl: 1   No Known Allergies   Social History   Tobacco Use   Smoking status: Never   Smokeless tobacco: Never  Vaping Use   Vaping Use: Never used  Substance  Use Topics   Alcohol use: No    Alcohol/week: 0.0 standard drinks   Drug use: No      Chart Review Today: I personally reviewed active problem list, medication list, allergies, family history, social history, health maintenance, notes from last encounter, lab results, imaging with the patient/caregiver today.   Review of Systems  Constitutional: Negative.   HENT: Negative.    Eyes: Negative.   Respiratory: Negative.    Cardiovascular: Negative.   Gastrointestinal: Negative.   Endocrine: Negative.   Genitourinary: Negative.   Musculoskeletal: Negative.    Skin: Negative.   Allergic/Immunologic: Negative.   Neurological: Negative.   Hematological: Negative.   Psychiatric/Behavioral: Negative.    All other systems reviewed and are negative.     Objective:   Vitals:   11/03/20 1446  BP: 122/64  Pulse: 77  Resp: 16  Temp: 98.2 F (36.8 C)  SpO2: 95%  Weight: 206 lb (93.4 kg)  Height: _0  (1.575 m)    Body mass index is 37.68 kg/m.  Physical Exam Vitals and nursing note reviewed.  Constitutional:      General: She is not in acute distress.    Appearance: Normal appearance. She is obese. She is not ill-appearing, toxic-appearing or diaphoretic.  HENT:     Head: Normocephalic and atraumatic.     Right Ear: External ear normal.     Left Ear: External ear normal.  Eyes:     Conjunctiva/sclera: Conjunctivae normal.  Cardiovascular:     Rate and Rhythm: Normal rate.     Pulses: Normal pulses.  Pulmonary:     Effort: Pulmonary effort is normal. No respiratory distress.  Skin:    Findings: Lesion present.     Comments: Lesion/abscess to anterior neck roughly 12 mm x 4-60m raised, mildly erythematous, two areas of pustule  No surrounding edema, erythema, induration  Neurological:     Mental Status: She is alert.  Psychiatric:        Mood and Affect: Mood normal.     Results for orders placed or performed in visit on 08/31/20  POCT HgB A1C  Result Value Ref Range   Hemoglobin A1C 7.5 (A) 4.0 - 5.6 %   HbA1c POC (<> result, manual entry)     HbA1c, POC (prediabetic range)     HbA1c, POC (controlled diabetic range)     I & D  Date/Time: 11/04/2020 6:38 PM Performed by: TDelsa Grana PA-C Authorized by: TDelsa Grana PA-C   Consent:    Consent obtained:  Verbal   Risks discussed:  Bleeding, incomplete drainage, infection and pain   Alternatives discussed:  No treatment, delayed treatment and alternative treatment Location:    Type:  Abscess   Size:  See above   Location:  Neck Pre-procedure details:    Skin  preparation:  Alcohol Sedation:    Sedation type:  None Anesthesia:    Anesthesia method:  None Procedure type:    Complexity:  Simple Procedure details:    Needle aspiration: no     Wound management:  Extensive cleaning   Drainage:  Purulent   Drainage amount:  Moderate   Wound treatment:  Wound left open   Packing materials:  None Post-procedure details:    Procedure completion:  Tolerated well, no immediate complications     Assessment & Plan:   1. Abscess of skin of neck Did warm soak today with warm water and hydrogen peroxide Suture removal kit was used with tweezers gauze and some manipulation  of the skin to open up the pustule areas and remove the purulence which was thick almost encapsulated like a small epidermal cyst The area of abscess did reduce in size with the fairly noninvasive procedure patient tolerated there was no blood loss Is covered with a bandage I explained that the whole cyst or abscess may not of been removed today and there may be recurrence of this and she would need referral to a specialist for a more surgical intervention.  Expect a little bit of swelling and inflammation over the next 24 hours but it should gradually improve prove and decrease in size after that.  Antibiotics were sent in only as a precaution in case she developed any redness or swelling.  Conservative management and wound care reviewed with the patient at length and all questions were asked and answered.  warm compresses to encourage drainage, light exfoliation, doxy if any redness or swelling develops  Follow-up as needed     Delsa Grana, PA-C 11/03/20 2:58 PM

## 2020-11-04 DIAGNOSIS — L0211 Cutaneous abscess of neck: Secondary | ICD-10-CM | POA: Diagnosis not present

## 2020-11-25 ENCOUNTER — Other Ambulatory Visit: Payer: Self-pay | Admitting: Family Medicine

## 2020-11-25 DIAGNOSIS — E785 Hyperlipidemia, unspecified: Secondary | ICD-10-CM

## 2020-11-25 DIAGNOSIS — I10 Essential (primary) hypertension: Secondary | ICD-10-CM

## 2020-11-25 NOTE — Telephone Encounter (Signed)
Requested Prescriptions  Pending Prescriptions Disp Refills  . amLODipine (NORVASC) 10 MG tablet [Pharmacy Med Name: AMLODIPINE BESYLATE 10 MG TAB] 90 tablet 0    Sig: TAKE 1 TABLET BY MOUTH EVERY DAY     Cardiovascular:  Calcium Channel Blockers Passed - 11/25/2020  1:27 AM      Passed - Last BP in normal range    BP Readings from Last 1 Encounters:  11/03/20 122/64         Passed - Valid encounter within last 6 months    Recent Outpatient Visits          3 weeks ago Abscess of skin of neck   [facility] Danelle Berry, PA-C   2 months ago Diabetes mellitus type 2 in obese [facility])   [facility] [place], Danna Hefty, MD   5 months ago Diabetes mellitus type 2 in obese [facility])   [facility] Alba Cory, MD   1 year ago Diabetes mellitus type 2 in obese [facility])   [facility] Alba Cory, MD   1 year ago Undifferentiated inflammatory arthritis [facility])   [facility] Alba Cory, MD      Future Appointments            In 1 week Alba Cory, MD [facility]           . atorvastatin (LIPITOR) 10 MG tablet [Pharmacy Med Name: ATORVASTATIN 10 MG TABLET] 90 tablet 1    Sig: TAKE 1 TABLET BY MOUTH EVERY DAY     Cardiovascular:  Antilipid - Statins Passed - 11/25/2020  1:27 AM      Passed - Total Cholesterol in normal range and within 360 days    Cholesterol, Total  Date Value Ref Range Status  04/01/2015 220 (H) 100 - 199 mg/dL Final   Cholesterol  Date Value Ref Range Status  06/03/2020 171 <200 mg/dL Final         Passed - LDL in normal range and within 360 days    LDL Cholesterol (Calc)  Date Value Ref Range Status  06/03/2020 64 mg/dL (calc) Final    Comment:    Reference range: <100 . Desirable range <100 mg/dL for primary prevention;   <70 mg/dL for patients with CHD or diabetic patients  with > or = 2 CHD risk factors. Marland Kitchen LDL-C is now  calculated using the Martin-Hopkins  calculation, which is a validated novel method providing  better accuracy than the Friedewald equation in the  estimation of LDL-C.  Horald Pollen et al. Lenox Ahr. 1740;814(48): 2061-2068  (http://education.[employer].com/faq/FAQ164)          Passed - HDL in normal range and within 360 days    HDL  Date Value Ref Range Status  06/03/2020 93 > OR = 50 mg/dL Final  18/56/3149 702 >63 mg/dL Final         Passed - Triglycerides in normal range and within 360 days    Triglycerides  Date Value Ref Range Status  06/03/2020 60 <150 mg/dL Final         Passed - Patient is not pregnant      Passed - Valid encounter within last 12 months    Recent Outpatient Visits          3 weeks ago Abscess of skin of neck   [facility] Danelle Berry, PA-C   2 months ago Diabetes mellitus type 2 in obese (HCC)  [facility] [place], Danna Hefty, MD   5 months ago Diabetes mellitus type 2 in obese [facility])   [facility] Alba Cory, MD   1 year ago Diabetes mellitus type 2 in obese [facility])   [facility] Alba Cory, MD   1 year ago Undifferentiated inflammatory arthritis [facility])   [facility] Alba Cory, MD      Future Appointments            In 1 week Alba Cory, MD [facility], [facility]           . olmesartan (BENICAR) 40 MG tablet [Pharmacy Med Name: OLMESARTAN MEDOXOMIL 40 MG TAB] 90 tablet 1    Sig: TAKE 1 TABLET BY MOUTH EVERY DAY     Cardiovascular:  Angiotensin Receptor Blockers Passed - 11/25/2020  1:27 AM      Passed - Cr in normal range and within 180 days    Creat  Date Value Ref Range Status  06/03/2020 0.69 0.50 - 1.05 mg/dL Final    Comment:    For patients >29 years of age, the reference limit for Creatinine is approximately 13% higher for people identified as African-American. .    Creatinine, POC  Date Value  Ref Range Status  04/13/2017 neg mg/dL Final   Creatinine, Urine  Date Value Ref Range Status  06/19/2018 250 20 - 275 mg/dL Final         Passed - K in normal range and within 180 days    Potassium  Date Value Ref Range Status  06/03/2020 3.9 3.5 - 5.3 mmol/L Final  02/26/2013 3.4 (L) 3.5 - 5.1 mmol/L Final         Passed - Patient is not pregnant      Passed - Last BP in normal range    BP Readings from Last 1 Encounters:  11/03/20 122/64         Passed - Valid encounter within last 6 months    Recent Outpatient Visits          3 weeks ago Abscess of skin of neck   [facility] Dan[facility] Berry, PA-C   2 months ago Diabetes mellitus type 2 in obese [facility])   [facility] [place], Danna Hefty, MD   5 months ago Diabetes mellitus type 2 in obese [facility])   [facility] Alba Cory, MD   1 year ago Diabetes mellitus type 2 in obese [facility])   [facility] Alba Cory, MD   1 year ago Undifferentiated inflammatory arthritis [facility])   [facility] Alba Cory, MD      Future Appointments            In 1 week Alba Cory, MD [facility]

## 2020-12-01 NOTE — Progress Notes (Signed)
Name: Shelia Vasquez   MRN: [id number]    DOB: 1965-05-20   Date:12/02/2020       Progress Note  Subjective  Chief Complaint  Follow Up  HPI  DMII: diagnosed back in Dec 2018 with hgb of  6.6%,  7% , 7.3 % , 7.5 % today it is down to 7.1 %  . She was on Rybelsus 14 mg but could not tolerate nausea and bloating, when she saw Dr. Gershon Vasquez in June he resumed Metformin 500 mg ER to take two daily   She denies polyphagia, polydipsia or polyuria at this time. Last urine micro negative, on ARB. She also has  obesity, dyslipidemia and takes atorvastatin last LDL was at goal at 64    HTN: she has not been taking medications consistently , she is on Benicar , metoprolol and Norvasc , off HcTZ due to hypercalcemia. She has some ankle swelling but controlled with compression stocking hoses    Major Depression Mild/Anxiety  : She has a long history of depression - since her teenage years. Symptoms got worse in 2017 when her father died . She has a 54 yo daughter with  autism and does not like getting out of the house, she states her daughter is still doing virtual school. She tried Duloxetine and Hydroxizine  in the past but stopped on her own  She states she is doing okay and does not want medications. Working from home is helpful. She is stable.   Inflammatory arthritis : seen by Rheumatologist  she is currently Plaquenil and was given Methotrexate April 2022 but she never started medications - afraid of side effects. She states pain is still present , worse on ankles , present on both ankles. She sates no problems with hands, she states pan is 7/10 on both ankles.    Hypothyroidism:she had level Dec by Dr. Gershon Vasquez she was compliant with medication at the time, currently taking 125 mcg , dose adjusted back in June and we will recheck it today   Morbid Obesity; BMI above 35 with co-morbidities such as DM, HTN, dyslipidemia, GERD. She states eating healthier food, also more active lately    GERD:  taking Tums  prn, states occasionally has dysphagia, food gets stuck on upper esophagus, she states she has noticed some symptoms because she has not been chewing her food as much and sometimes eating before bed.  Discussed referral to GI   Chronic constipation: stopped Linzess because of cost, she states currently doing okay, having regular bowel movements and sometimes diarrhea with Metformin   Hyperparathyroidism: last calcium back to normal, US negative, no symptoms and we will not get CT at this time.  She is off HCTZ, calcium improved and also PTh improved   Patient Active Problem List   Diagnosis Date Noted   Inflammatory polyarthritis (HCC) 08/31/2020   Positive ANA (antinuclear antibody) 07/26/2017   Diet-controlled type 2 diabetes mellitus (HCC) 07/12/2017   Chronic midline low back pain without sciatica 08/24/2016   Sedimentation rate elevation 07/18/2016   Elevated C-reactive protein (CRP) 12/09/2015   Hyperglycemia 12/09/2015   Benign hypertension 02/06/2015   Dyslipidemia 02/06/2015   Insomnia 02/06/2015   Gastro-esophageal reflux disease without esophagitis 02/06/2015   History of radioactive iodine thyroid ablation 02/06/2015   Hypothyroidism, postablative 02/06/2015   History of iron deficiency anemia 02/06/2015   Obesity (BMI 30.0-34.9) 02/06/2015   H/O: hysterectomy 02/06/2015   Vitamin D deficiency 02/06/2015   Seasonal allergic rhinitis 02/06/2015   Chronic  constipation 02/06/2015   History of uterine fibroid 05/29/2013    Past Surgical History:  Procedure Laterality Date   ABDOMINAL HYSTERECTOMY     BTL     CESAREAN SECTION     COLONOSCOPY WITH PROPOFOL N/A 03/07/2017   Procedure: COLONOSCOPY WITH PROPOFOL;  Surgeon: Midge Minium, MD;  Location: [facility] ENDOSCOPY;  Service: Endoscopy;  Laterality: N/A;   TUBAL LIGATION      Family History  Problem Relation Age of Onset   Arthritis Father    Autism Daughter    Breast cancer Neg Hx     Social History    Tobacco Use   Smoking status: Never   Smokeless tobacco: Never  Substance Use Topics   Alcohol use: No    Alcohol/week: 0.0 standard drinks     Current Outpatient Medications:    atorvastatin (LIPITOR) 10 MG tablet, TAKE 1 TABLET BY MOUTH EVERY DAY, Disp: 90 tablet, Rfl: 1   fluticasone (FLONASE) 50 MCG/ACT nasal spray, Place 2 sprays into both nostrils daily., Disp: 16 g, Rfl: 5   folic acid (FOLVITE) 1 MG tablet, Take 1 mg by mouth daily., Disp: , Rfl:    hydroxychloroquine (PLAQUENIL) 200 MG tablet, Take 200 mg by mouth 2 (two) times daily., Disp: , Rfl:    levothyroxine (SYNTHROID) 125 MCG tablet, Take 1 tablet by mouth daily., Disp: , Rfl:    loratadine (CLARITIN REDITABS) 10 MG dissolvable tablet, Take 1 tablet (10 mg total) by mouth daily., Disp: 30 tablet, Rfl: 2   metFORMIN (GLUCOPHAGE-XR) 500 MG 24 hr tablet, Take 500 mg by mouth 2 (two) times daily., Disp: , Rfl:    methotrexate (RHEUMATREX) 2.5 MG tablet, Take 5 tablets by mouth once a week., Disp: , Rfl:    olmesartan (BENICAR) 40 MG tablet, TAKE 1 TABLET BY MOUTH EVERY DAY, Disp: 90 tablet, Rfl: 1   Vitamin D, Ergocalciferol, (DRISDOL) 1.25 MG (50000 UNIT) CAPS capsule, Take 1 capsule (50,000 Units total) by mouth every 7 (seven) days., Disp: 12 capsule, Rfl: 1   amLODipine (NORVASC) 10 MG tablet, Take 1 tablet (10 mg total) by mouth daily., Disp: 90 tablet, Rfl: 0   metoprolol succinate (TOPROL-XL) 50 MG 24 hr tablet, Take 1 tablet (50 mg total) by mouth daily. Take with or immediately following a meal., Disp: 90 tablet, Rfl: 1  No Known Allergies  I personally reviewed active problem list, medication list, allergies, family history, social history, health maintenance with the patient/caregiver today.   ROS  Constitutional: Negative for fever , positive for weight change.  Respiratory: Negative for cough and shortness of breath.   Cardiovascular: Negative for chest pain or palpitations.  Gastrointestinal: Negative  for abdominal pain,change in bowel movements due to Metformin .  Musculoskeletal: Negative for gait problem but has ankle  joint swelling.  Skin: Negative for rash.  Neurological: Negative for dizziness or headache.  No other specific complaints in a complete review of systems (except as listed in HPI above).   Objective  Vitals:   12/02/20 0954  BP: 126/70  Pulse: 79  Resp: 16  Temp: 98.2 F (36.8 C)  SpO2: 98%  Weight: 200 lb (90.7 kg)  Height: 5\' 2"  (1.575 m)    Body mass index is 36.58 kg/m.  Physical Exam  Constitutional: Patient appears well-developed and well-nourished. Obese  No distress.  HEENT: head atraumatic, normocephalic, pupils equal and reactive to light, neck supple Cardiovascular: Normal rate, regular rhythm and normal heart sounds.  No murmur heard. No BLE  edema. Pulmonary/Chest: Effort normal and breath sounds normal. No respiratory distress. Abdominal: Soft.  There is no tenderness. Psychiatric: Patient has a normal mood and affect. behavior is normal. Judgment and thought content normal.   Recent Results (from the past 2160 hour(s))  POCT HgB A1C     Status: Abnormal   Collection Time: 12/02/20 10:04 AM  Result Value Ref Range   Hemoglobin A1C 7.1 (A) 4.0 - 5.6 %   HbA1c POC (<> result, manual entry)     HbA1c, POC (prediabetic range)     HbA1c, POC (controlled diabetic range)      Diabetic Foot Exam: Diabetic Foot Exam - Simple   Simple Foot Form Diabetic Foot exam was performed with the following findings: Yes   Visual Inspection No deformities, no ulcerations, no other skin breakdown bilaterally: Yes Sensation Testing Intact to touch and monofilament testing bilaterally: Yes Pulse Check Posterior Tibialis and Dorsalis pulse intact bilaterally: Yes Comments      PHQ2/9: Depression screen [facility] 2/9 12/02/2020 11/03/2020 08/31/2020 06/03/2020 01/03/2019  Decreased Interest 0 0 0 0 0  Down, Depressed, Hopeless 0 0 0 0 0  PHQ - 2 Score 0 0 0 0 0   Altered sleeping - 0 0 - 0  Tired, decreased energy - 0 0 - 0  Change in appetite - 0 0 - 1  Feeling bad or failure about yourself  - 0 0 - 0  Trouble concentrating - 0 0 - 0  Moving slowly or fidgety/restless - 0 0 - 0  Suicidal thoughts - 0 0 - 0  PHQ-9 Score - 0 0 - 1  Difficult doing work/chores - Not difficult at all - - Not difficult at all  Some recent data might be hidden    phq 9 is negative   Fall Risk: Fall Risk  12/02/2020 11/03/2020 08/31/2020 06/03/2020 01/03/2019  Falls in the past year? 0 0 0 0 0  Number falls in past yr: 0 0 0 0 0  Injury with Fall? 0 0 0 0 0     Functional Status Survey: Is the patient deaf or have difficulty hearing?: No Does the patient have difficulty seeing, even when wearing glasses/contacts?: No Does the patient have difficulty concentrating, remembering, or making decisions?: No Does the patient have difficulty walking or climbing stairs?: No Does the patient have difficulty dressing or bathing?: No Does the patient have difficulty doing errands alone such as visiting a doctor's office or shopping?: No   Assessment & Plan   1. Diabetes mellitus type 2 in obese (HCC)  - POCT HgB A1C - HM Diabetes Foot Exam  2. Hypothyroidism, postablative  - TSH  3. Hyperparathyroidism (HCC)   4. Morbid obesity (HCC)  Discussed with the patient the risk posed by an increased BMI. Discussed importance of portion control, calorie counting and at least 150 minutes of physical activity weekly. Avoid sweet beverages and drink more water. Eat at least 6 servings of fruit and vegetables daily    5. Dyslipidemia   6. Inflammatory polyarthritis (HCC)   7. Gastro-esophageal reflux disease without esophagitis   8. Vitamin D deficiency   9. Anemia of chronic disease   10. Benign hypertension  - amLODipine (NORVASC) 10 MG tablet; Take 1 tablet (10 mg total) by mouth daily.  Dispense: 90 tablet; Refill: 0 - metoprolol succinate (TOPROL-XL) 50  MG 24 hr tablet; Take 1 tablet (50 mg total) by mouth daily. Take with or immediately following a meal.  Dispense: 90  tablet; Refill: 1  11. Major depression in remission (HCC)   12. Need for shingles vaccine  - Varicella-zoster vaccine IM (Shingrix)

## 2020-12-02 ENCOUNTER — Other Ambulatory Visit: Payer: Self-pay

## 2020-12-02 ENCOUNTER — Ambulatory Visit (INDEPENDENT_AMBULATORY_CARE_PROVIDER_SITE_OTHER): Payer: 59 | Admitting: Family Medicine

## 2020-12-02 ENCOUNTER — Encounter: Payer: Self-pay | Admitting: Family Medicine

## 2020-12-02 VITALS — BP 126/70 | HR 79 | Temp 98.2°F | Resp 16 | Ht 62.0 in | Wt 200.0 lb

## 2020-12-02 DIAGNOSIS — Z23 Encounter for immunization: Secondary | ICD-10-CM | POA: Diagnosis not present

## 2020-12-02 DIAGNOSIS — E1169 Type 2 diabetes mellitus with other specified complication: Secondary | ICD-10-CM | POA: Diagnosis not present

## 2020-12-02 DIAGNOSIS — I10 Essential (primary) hypertension: Secondary | ICD-10-CM

## 2020-12-02 DIAGNOSIS — E669 Obesity, unspecified: Secondary | ICD-10-CM | POA: Diagnosis not present

## 2020-12-02 DIAGNOSIS — E785 Hyperlipidemia, unspecified: Secondary | ICD-10-CM

## 2020-12-02 DIAGNOSIS — E890 Postprocedural hypothyroidism: Secondary | ICD-10-CM

## 2020-12-02 DIAGNOSIS — E213 Hyperparathyroidism, unspecified: Secondary | ICD-10-CM

## 2020-12-02 DIAGNOSIS — E559 Vitamin D deficiency, unspecified: Secondary | ICD-10-CM

## 2020-12-02 DIAGNOSIS — D638 Anemia in other chronic diseases classified elsewhere: Secondary | ICD-10-CM

## 2020-12-02 DIAGNOSIS — M064 Inflammatory polyarthropathy: Secondary | ICD-10-CM

## 2020-12-02 DIAGNOSIS — F325 Major depressive disorder, single episode, in full remission: Secondary | ICD-10-CM

## 2020-12-02 DIAGNOSIS — K219 Gastro-esophageal reflux disease without esophagitis: Secondary | ICD-10-CM

## 2020-12-02 LAB — POCT GLYCOSYLATED HEMOGLOBIN (HGB A1C): Hemoglobin A1C: 7.1 % — AB (ref 4.0–5.6)

## 2020-12-02 MED ORDER — AMLODIPINE BESYLATE 10 MG PO TABS: 10.0000 mg | ORAL_TABLET | Freq: Every day | ORAL | 0 refills | Status: DC

## 2020-12-02 MED ORDER — METOPROLOL SUCCINATE ER 50 MG PO TB24: 50.0000 mg | ORAL_TABLET | Freq: Every day | ORAL | 1 refills | Status: DC

## 2020-12-03 ENCOUNTER — Other Ambulatory Visit: Payer: Self-pay | Admitting: Family Medicine

## 2020-12-03 LAB — TSH: TSH: 1.23 mIU/L

## 2020-12-23 ENCOUNTER — Other Ambulatory Visit: Payer: Self-pay | Admitting: Family Medicine

## 2020-12-23 DIAGNOSIS — Z1231 Encounter for screening mammogram for malignant neoplasm of breast: Secondary | ICD-10-CM

## 2021-02-04 ENCOUNTER — Other Ambulatory Visit: Payer: Self-pay

## 2021-02-04 ENCOUNTER — Ambulatory Visit
Admission: RE | Admit: 2021-02-04 | Discharge: 2021-02-04 | Disposition: A | Discharge status: Home / Self Care | Payer: 59 | Source: Ambulatory Visit | Attending: Family Medicine | Admitting: Family Medicine

## 2021-02-04 DIAGNOSIS — Z1231 Encounter for screening mammogram for malignant neoplasm of breast: Secondary | ICD-10-CM | POA: Diagnosis not present

## 2021-03-04 NOTE — Progress Notes (Signed)
Name: Shelia Vasquez   MRN: [id number]    DOB: 10/07/1965   Date:03/05/2021       Progress Note  Subjective  Chief Complaint  Follow Up  HPI  DMII: diagnosed back in Dec 2018 with hgb of  6.6%,  7% , 7.3 % , 7.5 % last visit down to 7.1 % but today is up to 7.6 % again  . She was on Rybelsus 14 mg but could not tolerate nausea and bloating, when she saw Dr. Honor Junes in June he resumed Metformin 500 mg ER however since still not at goal we will change to [facility] - discussed possible side effects  She denies polyphagia, polydipsia or polyuria at this time. Last urine micro was 50 and we will recheck it today , she is on ARB. She also has  obesity, dyslipidemia and takes atorvastatin last LDL was at goal at 64    HTN: she has not been taking medications consistently , she is on Benicar , metoprolol and Norvasc , off HcTZ due to hypercalcemia. She has some ankle swelling but controlled with compression stocking hoses when she wears it     Major Depression Mild/Anxiety  : She has a long history of depression - since her teenage years. Symptoms got worse in 08-28-15 when her father died . She has a 78 yo daughter with  autism and does not like getting out of the house, she states her daughter is still doing virtual school. She tried Duloxetine and Hydroxizine  in the past but stopped on her own  She states she is doing okay and does not want medications. She likes working from home  Inflammatory arthritis : seen by Rheumatologist  she was given Plaquenil and Methotrexate but she tried and did not work and also afraid of side effects.  She states pain is still present , worse on ankles , present on both ankles. She sates no problems with hands, she states symptoms are intermittent and denies pain at this time   Hypothyroidism:she had level Dec by Dr. Honor Junes she was compliant with medication at the time, currently taking 125 mcg and last visit it was at goal   Morbid Obesity; BMI above 35 with  co-morbidities such as DM, HTN, dyslipidemia, GERD. Weight is stable, trying to be more mindful about her diet    GERD: taking Tums  prn, states occasionally has dysphagia, food gets stuck on upper esophagus, she still does not want to go to GI   Chronic constipation: stopped Linzess because of cost, she states currently doing okay, having regular bowel movements since taking Metformin   Hyperparathyroidism: last calcium back to normal, [place] negative, no symptoms and we will not get CT at this time.  She is off HCTZ, calcium improved and last Pth done by Dr. Honor Junes was 90  Patient Active Problem List   Diagnosis Date Noted   Inflammatory polyarthritis ([place]) 08/31/2020   Positive ANA (antinuclear antibody) 07/26/2017   Diet-controlled type 2 diabetes mellitus ([place]) 07/12/2017   Chronic midline low back pain without sciatica 08/24/2016   Sedimentation rate elevation 07/18/2016   Elevated C-reactive protein (CRP) 12/09/2015   Hyperglycemia 12/09/2015   Benign hypertension 02/06/2015   Dyslipidemia 02/06/2015   Insomnia 02/06/2015   Gastro-esophageal reflux disease without esophagitis 02/06/2015   History of radioactive iodine thyroid ablation 02/06/2015   Hypothyroidism, postablative 02/06/2015   History of iron deficiency anemia 02/06/2015   Obesity (BMI 30.0-34.9) 02/06/2015   H/O: hysterectomy 02/06/2015   Vitamin  D deficiency 02/06/2015   Seasonal allergic rhinitis 02/06/2015   Chronic constipation 02/06/2015   History of uterine fibroid 05/29/2013    Past Surgical History:  Procedure Laterality Date   ABDOMINAL HYSTERECTOMY     BTL     CESAREAN SECTION     COLONOSCOPY WITH PROPOFOL N/A 03/07/2017   Procedure: COLONOSCOPY WITH PROPOFOL;  Surgeon: Lucilla Lame, MD;  Location: [facility] ENDOSCOPY;  Service: Endoscopy;  Laterality: N/A;   TUBAL LIGATION      Family History  Problem Relation Age of Onset   Arthritis Father    Autism Daughter    Breast cancer Neg Hx      Social History   Tobacco Use   Smoking status: Never   Smokeless tobacco: Never  Substance Use Topics   Alcohol use: No    Alcohol/week: 0.0 standard drinks     Current Outpatient Medications:    atorvastatin (LIPITOR) 10 MG tablet, TAKE 1 TABLET BY MOUTH EVERY DAY, Disp: 90 tablet, Rfl: 1   Dapagliflozin-metFORMIN HCl ER (XIGDUO XR) 01-999 MG TB24, Take 1 tablet by mouth daily. In place of Metformin, Disp: 90 tablet, Rfl: 0   fluticasone (FLONASE) 50 MCG/ACT nasal spray, Place 2 sprays into both nostrils daily., Disp: 16 g, Rfl: 5   folic acid (FOLVITE) 1 MG tablet, Take 1 mg by mouth daily., Disp: , Rfl:    hydroxychloroquine (PLAQUENIL) 200 MG tablet, Take 200 mg by mouth 2 (two) times daily., Disp: , Rfl:    levothyroxine (SYNTHROID) 125 MCG tablet, Take 1 tablet by mouth daily., Disp: , Rfl:    loratadine (CLARITIN REDITABS) 10 MG dissolvable tablet, Take 1 tablet (10 mg total) by mouth daily., Disp: 30 tablet, Rfl: 2   methotrexate (RHEUMATREX) 2.5 MG tablet, Take 5 tablets by mouth once a week., Disp: , Rfl:    metoprolol succinate (TOPROL-XL) 50 MG 24 hr tablet, Take 1 tablet (50 mg total) by mouth daily. Take with or immediately following a meal., Disp: 90 tablet, Rfl: 1   olmesartan (BENICAR) 40 MG tablet, TAKE 1 TABLET BY MOUTH EVERY DAY, Disp: 90 tablet, Rfl: 1   amLODipine (NORVASC) 10 MG tablet, Take 1 tablet (10 mg total) by mouth daily., Disp: 90 tablet, Rfl: 1   Vitamin D, Ergocalciferol, (DRISDOL) 1.25 MG (50000 UNIT) CAPS capsule, Take 1 capsule (50,000 Units total) by mouth every 7 (seven) days., Disp: 12 capsule, Rfl: 1  No Known Allergies  I personally reviewed active problem list, medication list, allergies, family history, social history, health maintenance with the patient/caregiver today.   ROS  Constitutional: Negative for fever or weight change.  Respiratory: Negative for cough and shortness of breath.   Cardiovascular: Negative for chest pain or  palpitations.  Gastrointestinal: Negative for abdominal pain, no bowel changes.  Musculoskeletal: Negative for gait problem or joint swelling.  Skin: Negative for rash.  Neurological: Negative for dizziness or headache.  No other specific complaints in a complete review of systems (except as listed in HPI above).   Objective  Vitals:   03/05/21 0834  BP: 122/70  Pulse: 79  Resp: 16  Temp: 98 F (36.7 C)  TempSrc: Oral  SpO2: 98%  Weight: 200 lb 9.6 oz (91 kg)  Height: 5\' 2"  (1.575 m)    Body mass index is 36.69 kg/m.  Physical Exam  Constitutional: Patient appears well-developed and well-nourished. Obese  No distress.  HEENT: head atraumatic, normocephalic, pupils equal and reactive to light,  neck supple Cardiovascular: Normal rate,  regular rhythm and normal heart sounds.  No murmur heard. Trace  BLE edema. Pulmonary/Chest: Effort normal and breath sounds normal. No respiratory distress. Abdominal: Soft.  There is no tenderness. Psychiatric: Patient has a normal mood and affect. behavior is normal. Judgment and thought content normal.   Recent Results (from the past 2160 hour(s))  POCT HgB A1C     Status: Abnormal   Collection Time: 03/05/21  8:51 AM  Result Value Ref Range   Hemoglobin A1C 7.6 (A) 4.0 - 5.6 %   HbA1c POC (<> result, manual entry)     HbA1c, POC (prediabetic range)     HbA1c, POC (controlled diabetic range)        PHQ2/9: Depression screen [facility] 2/9 03/05/2021 12/02/2020 11/03/2020 08/31/2020 06/03/2020  Decreased Interest 0 0 0 0 0  Down, Depressed, Hopeless 0 0 0 0 0  PHQ - 2 Score 0 0 0 0 0  Altered sleeping 0 - 0 0 -  Tired, decreased energy 0 - 0 0 -  Change in appetite 0 - 0 0 -  Feeling bad or failure about yourself  0 - 0 0 -  Trouble concentrating 0 - 0 0 -  Moving slowly or fidgety/restless 0 - 0 0 -  Suicidal thoughts 0 - 0 0 -  PHQ-9 Score 0 - 0 0 -  Difficult doing work/chores Not difficult at all - Not difficult at all - -  Some  recent data might be hidden    phq 9 is negative   Fall Risk: Fall Risk  03/05/2021 12/02/2020 11/03/2020 08/31/2020 06/03/2020  Falls in the past year? 0 0 0 0 0  Number falls in past yr: 0 0 0 0 0  Injury with Fall? 0 0 0 0 0  Risk for fall due to : No Fall Risks - - - -  Follow up Falls prevention discussed - - - -    Assessment & Plan   1. Type 2 diabetes mellitus with microalbuminuria, without long-term current use of insulin (HCC)  - POCT HgB A1C - POCT UA - Microalbumin  2. Inflammatory polyarthritis (HCC)  She is not taking any medications at this time, advised to call Dr. Allena Katz and go for a follow up to discuss other options   3. Morbid obesity (HCC)  Discussed with the patient the risk posed by an increased BMI. Discussed importance of portion control, calorie counting and at least 150 minutes of physical activity weekly. Avoid sweet beverages and drink more water. Eat at least 6 servings of fruit and vegetables daily    4. Hyperparathyroidism (HCC)  Seeing [place]   5. Major depression in remission [facility])  Doing well  6. Need for influenza vaccination  - Flu Vaccine QUAD 6+ mos PF IM (Fluarix Quad PF)  7. Benign hypertension  - amLODipine (NORVASC) 10 MG tablet; Take 1 tablet (10 mg total) by mouth daily.  Dispense: 90 tablet; Refill: 1  8. Vitamin D deficiency  - Vitamin D, Ergocalciferol, (DRISDOL) 1.25 MG (50000 UNIT) CAPS capsule; Take 1 capsule (50,000 Units total) by mouth every 7 (seven) days.  Dispense: 12 capsule; Refill: 1  9. Gastro-esophageal reflux disease without esophagitis   10. Hypothyroidism, postablative  Continue medication  11. Anemia of chronic disease   12. GAD (generalized anxiety disorder)

## 2021-03-05 ENCOUNTER — Other Ambulatory Visit: Payer: Self-pay

## 2021-03-05 ENCOUNTER — Ambulatory Visit (INDEPENDENT_AMBULATORY_CARE_PROVIDER_SITE_OTHER): Payer: 59 | Admitting: Family Medicine

## 2021-03-05 ENCOUNTER — Encounter: Payer: Self-pay | Admitting: Family Medicine

## 2021-03-05 VITALS — BP 122/70 | HR 79 | Temp 98.0°F | Resp 16 | Ht 62.0 in | Wt 200.6 lb

## 2021-03-05 DIAGNOSIS — I10 Essential (primary) hypertension: Secondary | ICD-10-CM

## 2021-03-05 DIAGNOSIS — E559 Vitamin D deficiency, unspecified: Secondary | ICD-10-CM

## 2021-03-05 DIAGNOSIS — D638 Anemia in other chronic diseases classified elsewhere: Secondary | ICD-10-CM

## 2021-03-05 DIAGNOSIS — E213 Hyperparathyroidism, unspecified: Secondary | ICD-10-CM | POA: Diagnosis not present

## 2021-03-05 DIAGNOSIS — E890 Postprocedural hypothyroidism: Secondary | ICD-10-CM

## 2021-03-05 DIAGNOSIS — F325 Major depressive disorder, single episode, in full remission: Secondary | ICD-10-CM

## 2021-03-05 DIAGNOSIS — M064 Inflammatory polyarthropathy: Secondary | ICD-10-CM

## 2021-03-05 DIAGNOSIS — R809 Proteinuria, unspecified: Secondary | ICD-10-CM

## 2021-03-05 DIAGNOSIS — F411 Generalized anxiety disorder: Secondary | ICD-10-CM

## 2021-03-05 DIAGNOSIS — K219 Gastro-esophageal reflux disease without esophagitis: Secondary | ICD-10-CM

## 2021-03-05 DIAGNOSIS — E1129 Type 2 diabetes mellitus with other diabetic kidney complication: Secondary | ICD-10-CM

## 2021-03-05 DIAGNOSIS — E1169 Type 2 diabetes mellitus with other specified complication: Secondary | ICD-10-CM

## 2021-03-05 DIAGNOSIS — Z23 Encounter for immunization: Secondary | ICD-10-CM

## 2021-03-05 LAB — POCT GLYCOSYLATED HEMOGLOBIN (HGB A1C): Hemoglobin A1C: 7.6 % — AB (ref 4.0–5.6)

## 2021-03-05 MED ORDER — XIGDUO XR 10-1000 MG PO TB24: 1.0000 | ORAL_TABLET | Freq: Every day | ORAL | 0 refills | Status: DC

## 2021-03-05 MED ORDER — AMLODIPINE BESYLATE 10 MG PO TABS: 10.0000 mg | ORAL_TABLET | Freq: Every day | ORAL | 1 refills | Status: DC

## 2021-03-05 MED ORDER — VITAMIN D (ERGOCALCIFEROL) 1.25 MG (50000 UNIT) PO CAPS: 50000.0000 [IU] | ORAL_CAPSULE | ORAL | 1 refills | Status: DC

## 2021-03-05 NOTE — Patient Instructions (Signed)
Contact pharmacy to get rx of Plaquenil

## 2021-03-06 LAB — MICROALBUMIN / CREATININE URINE RATIO
Creatinine, Urine: 157 mg/dL (ref 20–275)
Microalb Creat Ratio: 10 mcg/mg creat (ref <30)
Microalb, Ur: 1.6 mg/dL

## 2021-05-13 ENCOUNTER — Ambulatory Visit (INDEPENDENT_AMBULATORY_CARE_PROVIDER_SITE_OTHER): Payer: 59

## 2021-05-13 DIAGNOSIS — Z23 Encounter for immunization: Secondary | ICD-10-CM

## 2021-05-22 ENCOUNTER — Other Ambulatory Visit: Payer: Self-pay | Admitting: Family Medicine

## 2021-05-22 DIAGNOSIS — I10 Essential (primary) hypertension: Secondary | ICD-10-CM

## 2021-05-22 NOTE — Telephone Encounter (Signed)
Requested Prescriptions  Pending Prescriptions Disp Refills   metoprolol succinate (TOPROL-XL) 50 MG 24 hr tablet [Pharmacy Med Name: METOPROLOL SUCC ER 50 MG TAB] 90 tablet 1    Sig: TAKE 1 TABLET BY MOUTH DAILY. TAKE WITH OR IMMEDIATELY FOLLOWING A MEAL.     Cardiovascular:  Beta Blockers Passed - 05/22/2021 11:35 AM      Passed - Last BP in normal range    BP Readings from Last 1 Encounters:  03/05/21 122/70         Passed - Last Heart Rate in normal range    Pulse Readings from Last 1 Encounters:  03/05/21 79         Passed - Valid encounter within last 6 months    Recent Outpatient Visits          2 months ago Type 2 diabetes mellitus with microalbuminuria, without long-term current use of insulin [facility])   [facility] [place], Drue Stager, MD   5 months ago Diabetes mellitus type 2 in obese [facility])   [facility] Steele Sizer, MD   6 months ago Abscess of skin of neck   [facility] [place], Kristeen Miss, PA-C   8 months ago Diabetes mellitus type 2 in obese [facility])   [facility] Steele Sizer, MD   11 months ago Diabetes mellitus type 2 in obese [facility])   [facility] Steele Sizer, MD      Future Appointments            In 2 weeks Steele Sizer, MD [facility]

## 2021-05-30 ENCOUNTER — Other Ambulatory Visit: Payer: Self-pay | Admitting: Family Medicine

## 2021-05-30 DIAGNOSIS — E785 Hyperlipidemia, unspecified: Secondary | ICD-10-CM

## 2021-06-08 ENCOUNTER — Ambulatory Visit: Payer: 59 | Admitting: Family Medicine

## 2021-06-14 ENCOUNTER — Other Ambulatory Visit: Payer: Self-pay | Admitting: Family Medicine

## 2021-07-22 NOTE — Progress Notes (Signed)
Name: Shelia Vasquez   MRN: [id number]    DOB: [birth year]   Date:07/23/2021 ? ?     Progress Note ? ?Subjective ? ?Chief Complaint ? ?Follow Up ? ?HPI ? ?DMII: diagnosed back in Dec 2018 with hgb of  6.6%,  7% , 7.3 % , 7.5 % last visit down to 7.1 % last visit was up to 7.6 % . She could not tolerated Rybelsus, Metformin was not enough , she is current on Xigduo and we will recheck A1C today. She denies polyphagia, she has noticed polydipsia or polyuria over the past few months, but denies vaginitis  Urine micro used to be 50 but now back to normal on ARB. She also has  obesity, dyslipidemia and takes atorvastatin last LDL was at goal at 64  ?  ?HTN: she has not been taking medications consistently , she is on Benicar , metoprolol and Norvasc , off HcTZ due to hypercalcemia. She has some ankle swelling but controlled with compression stocking hoses . We will change to combo medication  ?  ?Major Depression Mild/Anxiety  : She has a long history of depression - since her teenage years. Symptoms got worse in 21-Aug-2015 when her father died . She has a 81 yo daughter with  autism and does not like getting out of the house, she states her daughter is still doing virtual school. She tried Duloxetine and Hydroxizine  in the past but stopped on her own She states she likes to work from home, explain to her it may cause her to be more isolated  ? ?Inflammatory arthritis : seen by Rheumatologist  she was given Plaquenil and Methotrexate, eye exam is up to date. She had a flare last month and is still low dose of prednisone due to left elbow pain and inflammation   ?  ?Hypothyroidism:she had level Dec by Dr. Gershon Crane she was compliant with medication at the time, currently taking 125 mcg and last visit it was at goal  ? ?Morbid Obesity; BMI above 35 with co-morbidities such as DM, HTN, dyslipidemia, GERD. Weight is down since last visit, eating smaller portions, but TSH was also suppressed and dose adjusted by Endo .  ?  ?GERD:  taking Tums  prn, states occasionally has dysphagia, food gets stuck on upper esophagus - still happens about once a week, avoiding bread and steaks,  - explained may have stricture but also needs to rule out a mass.  ? ?Chronic constipation: stopped Linzess because of cost, she states currently doing okay, she states bowel movements are daily now, she thinks due to Metformin  ? ?Hyperparathyroidism: last calcium back to normal, [place] negative, no symptoms and we will not get CT at this time.  She is off HCTZ, seeing Endo and is now on higher supplementation of vitamin D  ? ?Patient Active Problem List  ? Diagnosis Date Noted  ? Inflammatory polyarthritis (HCC) 08/31/2020  ? Positive ANA (antinuclear antibody) 07/26/2017  ? Diet-controlled type 2 diabetes mellitus (HCC) 07/12/2017  ? Chronic midline low back pain without sciatica 08/24/2016  ? Sedimentation rate elevation 07/18/2016  ? Elevated C-reactive protein (CRP) 12/09/2015  ? Hyperglycemia 12/09/2015  ? Benign hypertension 02/06/2015  ? Dyslipidemia 02/06/2015  ? Gastro-esophageal reflux disease without esophagitis 02/06/2015  ? History of radioactive iodine thyroid ablation 02/06/2015  ? Hypothyroidism, postablative 02/06/2015  ? History of iron deficiency anemia 02/06/2015  ? Obesity (BMI 30.0-34.9) 02/06/2015  ? H/O: hysterectomy 02/06/2015  ? Vitamin D deficiency 02/06/2015  ?  Seasonal allergic rhinitis 02/06/2015  ? History of uterine fibroid 05/29/2013  ? ? ?Past Surgical History:  ?Procedure Laterality Date  ? ABDOMINAL HYSTERECTOMY    ? BTL    ? CESAREAN SECTION    ? COLONOSCOPY WITH PROPOFOL N/A 03/07/2017  ? Procedure: COLONOSCOPY WITH PROPOFOL;  Surgeon: Midge MiniumWohl, Darren, MD;  [facility];  Service: Endoscopy;  Laterality: N/A;  ? TUBAL LIGATION    ? ? ?Family History  ?Problem Relation Age of Onset  ? Arthritis Father   ? Autism Daughter   ? Breast cancer Neg Hx   ? ? ?Social History  ? ?Tobacco Use  ? Smoking status: Never  ? Smokeless  tobacco: Never  ?Substance Use Topics  ? Alcohol use: No  ?  Alcohol/week: 0.0 standard drinks  ? ? ? ?Current Outpatient Medications:  ?  atorvastatin (LIPITOR) 10 MG tablet, TAKE 1 TABLET BY MOUTH EVERY DAY, Disp: 90 tablet, Rfl: 1 ?  fluticasone (FLONASE) 50 MCG/ACT nasal spray, Place 2 sprays into both nostrils daily., Disp: 16 g, Rfl: 5 ?  folic acid (FOLVITE) 1 MG tablet, Take 1 mg by mouth daily., Disp: , Rfl:  ?  hydroxychloroquine (PLAQUENIL) 200 MG tablet, Take 200 mg by mouth 2 (two) times daily., Disp: , Rfl:  ?  levothyroxine (SYNTHROID) 125 MCG tablet, Take 1 tablet by mouth daily at 12 noon. But half on Sunday's, Disp: , Rfl:  ?  methotrexate (RHEUMATREX) 2.5 MG tablet, Take 5 tablets by mouth once a week., Disp: , Rfl:  ?  metoprolol succinate (TOPROL-XL) 50 MG 24 hr tablet, TAKE 1 TABLET BY MOUTH DAILY. TAKE WITH OR IMMEDIATELY FOLLOWING A MEAL., Disp: 90 tablet, Rfl: 1 ?  predniSONE (DELTASONE) 5 MG tablet, PLEASE SEE ATTACHED FOR DETAILED DIRECTIONS, Disp: , Rfl:  ?  Vitamin D, Ergocalciferol, (DRISDOL) 1.25 MG (50000 UNIT) CAPS capsule, Take 1 capsule (50,000 Units total) by mouth every 7 (seven) days., Disp: 12 capsule, Rfl: 1 ?  XIGDUO XR 01-999 MG TB24, TAKE 1 TABLET BY MOUTH DAILY. IN PLACE OF METFORMIN, Disp: 90 tablet, Rfl: 0 ? ?No Known Allergies ? ?I personally reviewed active problem list, medication list, allergies, family history, social history, health maintenance with the patient/caregiver today. ? ? ?ROS ? ?Constitutional: Negative for fever or weight change.  ?Respiratory: Negative for cough and shortness of breath.   ?Cardiovascular: Negative for chest pain or palpitations.  ?Gastrointestinal: Negative for abdominal pain, no bowel changes.  ?Musculoskeletal: Negative for gait problem or joint swelling.  ?Skin: Negative for rash.  ?Neurological: Negative for dizziness or headache.  ?No other specific complaints in a complete review of systems (except as listed in HPI above).   ? ?Objective ? ?Vitals:  ? 07/23/21 0922  ?BP: 124/80  ?Pulse: 69  ?Resp: 14  ?Temp: 97.6 ?F (36.4 ?C)  ?TempSrc: Oral  ?SpO2: 97%  ?Weight: 193 lb 9.6 oz (87.8 kg)  ?Height: 5\' 2"  (1.575 m)  ? ? ?Body mass index is 35.41 kg/m?. ? ?Physical Exam ? ?Constitutional: Patient appears well-developed and well-nourished. Obese  No distress.  ?HEENT: head atraumatic, normocephalic, pupils equal and reactive to light,, neck supple ?Cardiovascular: Normal rate, regular rhythm and normal heart sounds.  No murmur heard. No BLE edema. ?Pulmonary/Chest: Effort normal and breath sounds normal. No respiratory distress. ?Abdominal: Soft.  There is no tenderness. ?Psychiatric: Patient has a normal mood and affect. behavior is normal. Judgment and thought content normal.  ? ?PHQ2/9: ? ?  07/23/2021  ?  9:21 AM 03/05/2021  ?  8:33 AM 12/02/2020  ?  9:53 AM 11/03/2020  ?  2:51 PM 08/31/2020  ? 10:17 AM  ?Depression screen PHQ 2/9  ?Decreased Interest 0 0 0 0 0  ?Down, Depressed, Hopeless 0 0 0 0 0  ?PHQ - 2 Score 0 0 0 0 0  ?Altered sleeping 0 0  0 0  ?Tired, decreased energy 0 0  0 0  ?Change in appetite 0 0  0 0  ?Feeling bad or failure about yourself  0 0  0 0  ?Trouble concentrating 0 0  0 0  ?Moving slowly or fidgety/restless 0 0  0 0  ?Suicidal thoughts 0 0  0 0  ?PHQ-9 Score 0 0  0 0  ?Difficult doing work/chores  Not difficult at all  Not difficult at all   ?  ?phq 9 is negative ? ? ?Fall Risk: ? ?  07/23/2021  ?  9:20 AM 03/05/2021  ?  8:33 AM 12/02/2020  ?  9:53 AM 11/03/2020  ?  2:51 PM 08/31/2020  ? 10:17 AM  ?Fall Risk   ?Falls in the past year? 0 0 0 0 0  ?Number falls in past yr:  0 0 0 0  ?Injury with Fall?  0 0 0 0  ?Risk for fall due to :  No Fall Risks     ?Follow up Falls prevention discussed Falls prevention discussed     ? ? ? ? ?Assessment & Plan ? ?1. Type 2 diabetes mellitus with microalbuminuria, without long-term current use of insulin (HCC) ? ?- HgB A1c ?- Dapagliflozin-metFORMIN HCl ER (XIGDUO XR) 01-999 MG TB24;  Take 1 tablet by mouth daily. In place of Metformin  Dispense: 90 tablet; Refill: 1 ? ?2. Morbid obesity (HCC) ? ?Discussed with the patient the risk posed by an increased BMI. Discussed importance of portion con

## 2021-07-23 ENCOUNTER — Encounter: Payer: Self-pay | Admitting: Family Medicine

## 2021-07-23 ENCOUNTER — Ambulatory Visit (INDEPENDENT_AMBULATORY_CARE_PROVIDER_SITE_OTHER): Payer: 59 | Admitting: Family Medicine

## 2021-07-23 ENCOUNTER — Other Ambulatory Visit: Payer: Self-pay

## 2021-07-23 VITALS — BP 124/80 | HR 69 | Temp 97.6°F | Resp 14 | Ht 62.0 in | Wt 193.6 lb

## 2021-07-23 DIAGNOSIS — R198 Other specified symptoms and signs involving the digestive system and abdomen: Secondary | ICD-10-CM

## 2021-07-23 DIAGNOSIS — D638 Anemia in other chronic diseases classified elsewhere: Secondary | ICD-10-CM

## 2021-07-23 DIAGNOSIS — R809 Proteinuria, unspecified: Secondary | ICD-10-CM

## 2021-07-23 DIAGNOSIS — E559 Vitamin D deficiency, unspecified: Secondary | ICD-10-CM

## 2021-07-23 DIAGNOSIS — F411 Generalized anxiety disorder: Secondary | ICD-10-CM

## 2021-07-23 DIAGNOSIS — E213 Hyperparathyroidism, unspecified: Secondary | ICD-10-CM | POA: Diagnosis not present

## 2021-07-23 DIAGNOSIS — I10 Essential (primary) hypertension: Secondary | ICD-10-CM

## 2021-07-23 DIAGNOSIS — J3089 Other allergic rhinitis: Secondary | ICD-10-CM

## 2021-07-23 DIAGNOSIS — M064 Inflammatory polyarthropathy: Secondary | ICD-10-CM

## 2021-07-23 DIAGNOSIS — E890 Postprocedural hypothyroidism: Secondary | ICD-10-CM

## 2021-07-23 DIAGNOSIS — J302 Other seasonal allergic rhinitis: Secondary | ICD-10-CM

## 2021-07-23 DIAGNOSIS — R1313 Dysphagia, pharyngeal phase: Secondary | ICD-10-CM

## 2021-07-23 DIAGNOSIS — E1129 Type 2 diabetes mellitus with other diabetic kidney complication: Secondary | ICD-10-CM | POA: Diagnosis not present

## 2021-07-23 DIAGNOSIS — E785 Hyperlipidemia, unspecified: Secondary | ICD-10-CM

## 2021-07-23 DIAGNOSIS — F325 Major depressive disorder, single episode, in full remission: Secondary | ICD-10-CM

## 2021-07-23 DIAGNOSIS — K219 Gastro-esophageal reflux disease without esophagitis: Secondary | ICD-10-CM

## 2021-07-23 MED ORDER — XIGDUO XR 10-1000 MG PO TB24: 1.0000 | ORAL_TABLET | Freq: Every day | ORAL | 1 refills | Status: DC

## 2021-07-23 MED ORDER — AMLODIPINE-OLMESARTAN 10-40 MG PO TABS
1.0000 | ORAL_TABLET | Freq: Every day | ORAL | 1 refills | Status: DC
Start: 2021-07-23 — End: 2021-11-26

## 2021-07-24 LAB — LIPID PANEL
Cholesterol: 200 mg/dL — ABNORMAL HIGH (ref <200)
HDL: 123 mg/dL (ref >=50)
LDL Cholesterol (Calc): 63 mg/dL (calc)
Non-HDL Cholesterol (Calc): 77 mg/dL (calc) (ref <130)
Total CHOL/HDL Ratio: 1.6 (calc) (ref <5.0)
Triglycerides: 63 mg/dL (ref <150)

## 2021-07-24 LAB — COMPLETE METABOLIC PANEL WITH GFR
AG Ratio: 1.3 (calc) (ref 1.0–2.5)
ALT: 10 U/L (ref 6–29)
AST: 13 U/L (ref 10–35)
Albumin: 4 g/dL (ref 3.6–5.1)
Alkaline phosphatase (APISO): 87 U/L (ref 37–153)
BUN: 15 mg/dL (ref 7–25)
CO2: 27 mmol/L (ref 20–32)
Calcium: 9.8 mg/dL (ref 8.6–10.4)
Chloride: 103 mmol/L (ref 98–110)
Creat: 0.68 mg/dL (ref 0.50–1.03)
Globulin: 3.1 g/dL (calc) (ref 1.9–3.7)
Glucose, Bld: 82 mg/dL (ref 65–99)
Potassium: 3.8 mmol/L (ref 3.5–5.3)
Sodium: 141 mmol/L (ref 135–146)
Total Bilirubin: 0.3 mg/dL (ref 0.2–1.2)
Total Protein: 7.1 g/dL (ref 6.1–8.1)
eGFR: 103 mL/min/{1.73_m2} (ref >=60)

## 2021-07-24 LAB — HEMOGLOBIN A1C
Hgb A1c MFr Bld: 6.9 % of total Hgb — ABNORMAL HIGH (ref <5.7)
Mean Plasma Glucose: 151 mg/dL
eAG (mmol/L): 8.4 mmol/L

## 2021-08-04 ENCOUNTER — Other Ambulatory Visit: Payer: Self-pay | Admitting: Family Medicine

## 2021-08-04 DIAGNOSIS — E559 Vitamin D deficiency, unspecified: Secondary | ICD-10-CM

## 2021-10-21 ENCOUNTER — Other Ambulatory Visit: Payer: Self-pay | Admitting: Internal Medicine

## 2021-10-21 DIAGNOSIS — E559 Vitamin D deficiency, unspecified: Secondary | ICD-10-CM

## 2021-10-21 NOTE — Telephone Encounter (Signed)
Requested medication (s) are due for refill today:   Yes  Requested medication (s) are on the active medication list:   Yes  Future visit scheduled:   Yes   Last ordered: 08/05/2021 #12, 0 refills  Returned because non delegated refill and vitamin D level is due per protocol   Requested Prescriptions  Pending Prescriptions Disp Refills   Vitamin D, Ergocalciferol, (DRISDOL) 1.25 MG (50000 UNIT) CAPS capsule [Pharmacy Med Name: VITAMIN D2 1.25MG (50,000 UNIT)] 12 capsule 0    Sig: TAKE 1 CAPSULE (50,000 UNITS TOTAL) BY MOUTH EVERY 7 (SEVEN) DAYS     Endocrinology:  Vitamins - Vitamin D Supplementation 2 Failed - 10/21/2021  8:32 AM      Failed - Manual Review: Route requests for 50,000 IU strength to the provider      Failed - Vitamin D in normal range and within 360 days    Vit D, 25-Hydroxy  Date Value Ref Range Status  09/11/2019 33 30 - 100 ng/mL Final    Comment:    Vitamin D Status         25-OH Vitamin D: . Deficiency:                    <20 ng/mL Insufficiency:             20 - 29 ng/mL Optimal:                 > or = 30 ng/mL . For 25-OH Vitamin D testing on patients on  D2-supplementation and patients for whom quantitation  of D2 and D3 fractions is required, the [employer](TM) 25-OH VIT D, (D2,D3), LC/MS/MS is recommended: order  code 78295 (patients >38yrs). See Note 1 . Note 1 . For additional information, please refer to  http://education.Quest[facility].com/faq/FAQ199  (This link is being provided for informational/ educational purposes only.)          Passed - Ca in normal range and within 360 days    Calcium  Date Value Ref Range Status  07/23/2021 9.8 8.6 - 10.4 mg/dL Final   Calcium, Total  Date Value Ref Range Status  02/26/2013 9.4 8.5 - 10.1 mg/dL Final         Passed - Valid encounter within last 12 months    Recent Outpatient Visits           3 months ago Type 2 diabetes mellitus with microalbuminuria, without long-term current use of  insulin [facility])   [facility] [place], Danna Hefty, MD   7 months ago Type 2 diabetes mellitus with microalbuminuria, without long-term current use of insulin [facility])   [facility] [place], Danna Hefty, MD   10 months ago Diabetes mellitus type 2 in obese [facility])   [facility] [employer] Alba Cory, MD   11 months ago Abscess of skin of neck   [facility] [place], Sheliah Mends, PA-C   1 year ago Diabetes mellitus type 2 in obese [facility])   [facility] Alba Cory, MD       Future Appointments             In 1 month Carlynn Purl, Danna Hefty, MD [facility]

## 2021-11-15 ENCOUNTER — Ambulatory Visit (INDEPENDENT_AMBULATORY_CARE_PROVIDER_SITE_OTHER): Payer: 59 | Admitting: Gastroenterology

## 2021-11-15 ENCOUNTER — Encounter: Payer: Self-pay | Admitting: Gastroenterology

## 2021-11-15 ENCOUNTER — Other Ambulatory Visit: Payer: Self-pay

## 2021-11-15 VITALS — BP 123/76 | HR 67 | Temp 98.2°F | Ht 62.0 in | Wt 190.6 lb

## 2021-11-15 DIAGNOSIS — R1319 Other dysphagia: Secondary | ICD-10-CM | POA: Diagnosis not present

## 2021-11-15 DIAGNOSIS — D509 Iron deficiency anemia, unspecified: Secondary | ICD-10-CM

## 2021-11-15 DIAGNOSIS — R1310 Dysphagia, unspecified: Secondary | ICD-10-CM

## 2021-11-15 NOTE — Progress Notes (Signed)
Shelia Repress, MD [address]  [address]  [place], [place] [address]  Main: [phone]  Fax: [phone]    Gastroenterology Consultation  Referring Provider:     Alba Cory, MD Primary Care Physician:  Shelia Cory, MD Primary Gastroenterologist:  Dr. Arlyss Vasquez Reason for Consultation: Difficulty swallowing        HPI:   Shelia Vasquez is a 56 y.o. female referred by Dr. Alba Cory, MD  for consultation & management of difficulty swallowing.  Patient reports that she has been experiencing trouble swallowing, especially with the pills and sometimes bread, steak.  She reports food getting stuck in her throat, happens about once a week.  She denies any heartburn, does report regurgitation.  Review of labs revealed past history of iron deficiency anemia.  Current hemoglobin is 11.2 patient is also currently being treated for inflammatory arthritis with Plaquenil and methotrexate with daily folic acid. Patient does report that ever since she started taking metformin, she does have intermittent loose stools.  She denies any rectal bleeding  Patient does not smoke or drink alcohol  NSAIDs: None  Antiplts/Anticoagulants/Anti thrombotics: None  GI Procedures:  Colonoscopy 03/07/2017 - The entire examined colon is normal. - No specimens collected.  Past Medical History:  Diagnosis Date   Anemia    Anxiety    Depression    GERD (gastroesophageal reflux disease)    High cholesterol    Hypertension    Insomnia    Joint pain    Obesity    Personal history of radiation exposure    Thyroid disease    Vitamin D deficiency     Past Surgical History:  Procedure Laterality Date   ABDOMINAL HYSTERECTOMY     BTL     CESAREAN SECTION     COLONOSCOPY WITH PROPOFOL N/A 03/07/2017   Procedure: COLONOSCOPY WITH PROPOFOL;  Surgeon: Midge Minium, MD;  Location: [facility] ENDOSCOPY;  Service: Endoscopy;  Laterality: N/A;   TUBAL LIGATION       Current  Outpatient Medications:    amLODipine-olmesartan (AZOR) 10-40 MG tablet, Take 1 tablet by mouth daily. In place of Olmesartan and Amlodipine for BP, Disp: 90 tablet, Rfl: 1   atorvastatin (LIPITOR) 10 MG tablet, TAKE 1 TABLET BY MOUTH EVERY DAY, Disp: 90 tablet, Rfl: 1   diclofenac Sodium (VOLTAREN) 1 % GEL, Apply 2 g topically 4 (four) times daily., Disp: , Rfl:    fluticasone (FLONASE) 50 MCG/ACT nasal spray, Place 2 sprays into both nostrils daily., Disp: 16 g, Rfl: 5   folic acid (FOLVITE) 1 MG tablet, Take 1 mg by mouth daily., Disp: , Rfl:    hydroxychloroquine (PLAQUENIL) 200 MG tablet, Take 1 tablet by mouth 2 (two) times daily., Disp: , Rfl:    levothyroxine (SYNTHROID) 125 MCG tablet, Take by mouth., Disp: , Rfl:    methotrexate (RHEUMATREX) 2.5 MG tablet, Take 5 tablets by mouth once a week., Disp: , Rfl:    metoprolol succinate (TOPROL-XL) 50 MG 24 hr tablet, TAKE 1 TABLET BY MOUTH DAILY. TAKE WITH OR IMMEDIATELY FOLLOWING A MEAL., Disp: 90 tablet, Rfl: 1   Vitamin D, Ergocalciferol, (DRISDOL) 1.25 MG (50000 UNIT) CAPS capsule, TAKE 1 CAPSULE (50,000 UNITS TOTAL) BY MOUTH EVERY 7 (SEVEN) DAYS, Disp: 12 capsule, Rfl: 0   metFORMIN (GLUCOPHAGE-XR) 500 MG 24 hr tablet, Take 500 mg by mouth 2 (two) times daily. (Patient not taking: Reported on 11/15/2021), Disp: , Rfl:    predniSONE (DELTASONE) 5 MG  tablet, PLEASE SEE ATTACHED FOR DETAILED DIRECTIONS (Patient not taking: Reported on 11/15/2021), Disp: , Rfl:    Family History  Problem Relation Age of Onset   Arthritis Father    Autism Daughter    Breast cancer Neg Hx      Social History   Tobacco Use   Smoking status: Never   Smokeless tobacco: Never  Vaping Use   Vaping Use: Never used  Substance Use Topics   Alcohol use: No    Alcohol/week: 0.0 standard drinks of alcohol   Drug use: No    Allergies as of 11/15/2021   (No Known Allergies)    Review of Systems:    All systems reviewed and negative except where noted in  HPI.   Physical Exam:  BP 123/76 (BP Location: Left Arm, Patient Position: Sitting, Cuff Size: Large)   Pulse 67   Temp 98.2 F (36.8 C) (Oral)   Ht 5\' 2"  (1.575 m)   Wt 190 lb 9.6 oz (86.5 kg)   BMI 34.86 kg/m  No LMP recorded. Patient has had a hysterectomy.  General:   Alert,  Well-developed, well-nourished, pleasant and cooperative in NAD Head:  Normocephalic and atraumatic. Eyes:  Sclera clear, no icterus.   Conjunctiva pink. Ears:  Normal auditory acuity. Nose:  No deformity, discharge, or lesions. Mouth:  No deformity or lesions,oropharynx pink & moist. Neck:  Supple; no masses or thyromegaly. Lungs:  Respirations even and unlabored.  Clear throughout to auscultation.   No wheezes, crackles, or rhonchi. No acute distress. Heart:  Regular rate and rhythm; no murmurs, clicks, rubs, or gallops. Abdomen:  Normal bowel sounds. Soft, non-tender and non-distended without masses, hepatosplenomegaly or hernias noted.  No guarding or rebound tenderness.   Rectal: Not performed Msk:  Symmetrical without gross deformities. Good, equal movement & strength bilaterally. Pulses:  Normal pulses noted. Extremities:  No clubbing or edema.  No cyanosis. Neurologic:  Alert and oriented x3;  grossly normal neurologically. Skin:  Intact without significant lesions or rashes. No jaundice. Psych:  Alert and cooperative. Normal mood and affect.  Imaging Studies: No abdominal imaging  Assessment and Plan:   Shelia Vasquez is a 56 y.o. African-American female with history of metabolic syndrome, hypothyroidism is seen in consultation for chronic dysphagia to pills and solids  Recommend EGD for further evaluation  History of mild iron deficiency anemia.  Patient reports history of menorrhagia, underwent partial hysterectomy Recommend to check iron panel, B12 and folate levels Recommend gastric biopsies during EGD  Follow up based on the above work-up   53, MD

## 2021-11-16 ENCOUNTER — Telehealth: Payer: Self-pay

## 2021-11-16 LAB — B12 AND FOLATE PANEL
Folate: >20 ng/mL (ref >3.0)
Vitamin B-12: 781 pg/mL (ref 232–1245)

## 2021-11-16 LAB — IRON,TIBC AND FERRITIN PANEL
Ferritin: 52 ng/mL (ref 15–150)
Iron Saturation: 9 % — CL (ref 15–55)
Iron: 31 ug/dL (ref 27–159)
Total Iron Binding Capacity: 338 ug/dL (ref 250–450)
UIBC: 307 ug/dL (ref 131–425)

## 2021-11-16 NOTE — Telephone Encounter (Signed)
-----   Message from Toney Reil, MD sent at 11/16/2021  3:36 PM EDT ----- Patient has mild iron deficiency anemia.  Recommend to take oral iron supplements 1 pill a day or every other day for 3 months  RV

## 2021-11-16 NOTE — Telephone Encounter (Signed)
Patient verbalized understanding of results  

## 2021-11-18 ENCOUNTER — Telehealth: Payer: Self-pay

## 2021-11-18 NOTE — Telephone Encounter (Signed)
Patient states she is unable to pay for the EGD at this time and needs to cancel procedure. Call Trish and got procedure canceled

## 2021-11-23 ENCOUNTER — Ambulatory Visit: Admission: RE | Admit: 2021-11-23 | Payer: 59 | Source: Home / Self Care | Admitting: Gastroenterology

## 2021-11-23 ENCOUNTER — Encounter: Admission: RE | Payer: Self-pay | Source: Home / Self Care

## 2021-11-23 SURGERY — ESOPHAGOGASTRODUODENOSCOPY (EGD) WITH PROPOFOL
Anesthesia: General

## 2021-11-25 NOTE — Progress Notes (Signed)
Name: Shelia Vasquez   MRN: [id number]    DOB: 1966/04/05   Date:11/26/2021       Progress Note  Subjective  Chief Complaint  Follow Up  HPI  Name: Shelia Vasquez   MRN: [id number]    DOB: 06/20/1965   Date:07/23/2021       Progress Note  Subjective  Chief Complaint  Follow Up  HPI  DMII: diagnosed back in Dec 2018 with hgb of  6.6%,  7% , 7.3 % , 7.5 % 7.1 %  7.6 %, 6.9 % and today is 6.2 %  . She could not tolerated Rybelsus, Metformin was not enough , she is current on Xigduo  She denies polyphagia, polydipsia or polyuria   Urine micro used to be 50 but now back to normal on ARB. She also has  obesity, dyslipidemia and takes atorvastatin last LDL was at goal at 64    HTN: she has not been taking medications consistently , she is on Benicar , metoprolol and Norvasc , off HcTZ due to hypercalcemia. No chest pain but has occasional palpitation    Major Depression Mild/Anxiety  : She has a long history of depression - since her teenage years. Symptoms got worse in 09/09/15 when her father died . She has a 56 yo daughter with  autism and does not like getting out of the house, she states her daughter is still doing virtual school. She tried Duloxetine and Hydroxizine  in the past but stopped on her own She states she likes to work from home. Unchanged   Inflammatory arthritis : seen by Rheumatologist  she was given Plaquenil and Methotrexate, eye exam is up to date. She states worse pain is on both ankles and knees, wakes up feeling stiff, no swelling or redness    Hypothyroidism:she had level Dec by Dr. Gershon Crane she was compliant with medication at the time, currently taking 125 mcg and last visit it was at goal   Obesity: BMI is now below 35, she has been eating smaller portions    GERD: taking Tums  prn, states occasionally has dysphagia, food gets stuck on upper esophagus - she was seen by GI and was supposed to have an EGD but cancelled procedure due to cot.   Chronic  constipation: she states since on Xigduo her bowel movements are now daily   Hyperparathyroidism: last calcium back to normal, [place] negative, no symptoms and we will not get CT at this time.  She is off HCTZ, seeing Endo and is now on higher supplementation of vitamin D , last pth still above normal at 66  Patient Active Problem List   Diagnosis Date Noted   Inflammatory polyarthritis (HCC) 08/31/2020   Positive ANA (antinuclear antibody) 07/26/2017   Diet-controlled type 2 diabetes mellitus (HCC) 07/12/2017   Chronic midline low back pain without sciatica 08/24/2016   Sedimentation rate elevation 07/18/2016   Elevated C-reactive protein (CRP) 12/09/2015   Hyperglycemia 12/09/2015   Benign hypertension 02/06/2015   Dyslipidemia 02/06/2015   Gastro-esophageal reflux disease without esophagitis 02/06/2015   History of radioactive iodine thyroid ablation 02/06/2015   Hypothyroidism, postablative 02/06/2015   History of iron deficiency anemia 02/06/2015   Obesity (BMI 30.0-34.9) 02/06/2015   H/O: hysterectomy 02/06/2015   Vitamin D deficiency 02/06/2015   Seasonal allergic rhinitis 02/06/2015   History of uterine fibroid 05/29/2013    Past Surgical History:  Procedure Laterality Date   ABDOMINAL HYSTERECTOMY     BTL  CESAREAN SECTION     COLONOSCOPY WITH PROPOFOL N/A 03/07/2017   Procedure: COLONOSCOPY WITH PROPOFOL;  Surgeon: Midge Minium, MD;  Location: [facility] ENDOSCOPY;  Service: Endoscopy;  Laterality: N/A;   TUBAL LIGATION      Family History  Problem Relation Age of Onset   Arthritis Father    Autism Daughter    Breast cancer Neg Hx     Social History   Tobacco Use   Smoking status: Never   Smokeless tobacco: Never  Substance Use Topics   Alcohol use: No    Alcohol/week: 0.0 standard drinks of alcohol     Current Outpatient Medications:    amLODipine-olmesartan (AZOR) 10-40 MG tablet, Take 1 tablet by mouth daily. In place of Olmesartan and Amlodipine for BP,  Disp: 90 tablet, Rfl: 1   atorvastatin (LIPITOR) 10 MG tablet, TAKE 1 TABLET BY MOUTH EVERY DAY, Disp: 90 tablet, Rfl: 1   diclofenac Sodium (VOLTAREN) 1 % GEL, Apply 2 g topically 4 (four) times daily., Disp: , Rfl:    fluticasone (FLONASE) 50 MCG/ACT nasal spray, Place 2 sprays into both nostrils daily., Disp: 16 g, Rfl: 5   folic acid (FOLVITE) 1 MG tablet, Take 1 mg by mouth daily., Disp: , Rfl:    hydroxychloroquine (PLAQUENIL) 200 MG tablet, Take 1 tablet by mouth 2 (two) times daily., Disp: , Rfl:    levothyroxine (SYNTHROID) 125 MCG tablet, Take by mouth., Disp: , Rfl:    methotrexate (RHEUMATREX) 2.5 MG tablet, Take 5 tablets by mouth once a week., Disp: , Rfl:    metoprolol succinate (TOPROL-XL) 50 MG 24 hr tablet, TAKE 1 TABLET BY MOUTH DAILY. TAKE WITH OR IMMEDIATELY FOLLOWING A MEAL., Disp: 90 tablet, Rfl: 1   Vitamin D, Ergocalciferol, (DRISDOL) 1.25 MG (50000 UNIT) CAPS capsule, TAKE 1 CAPSULE (50,000 UNITS TOTAL) BY MOUTH EVERY 7 (SEVEN) DAYS, Disp: 12 capsule, Rfl: 0  No Known Allergies  I personally reviewed active problem list, medication list, allergies, family history, social history, health maintenance with the patient/caregiver today.   ROS  Constitutional: Negative for fever or weight change.  Respiratory: Negative for cough and shortness of breath.   Cardiovascular: Negative for chest pain or palpitations.  Gastrointestinal: Negative for abdominal pain, no bowel changes.  Musculoskeletal: Negative for gait problem or joint swelling.  Skin: Negative for rash.  Neurological: Negative for dizziness or headache.  No other specific complaints in a complete review of systems (except as listed in HPI above).   Objective  Vitals:   11/26/21 0816  BP: 112/76  Pulse: 80  Resp: 16  SpO2: 99%  Weight: 190 lb (86.2 kg)  Height: 5\' 2"  (1.575 m)    Body mass index is 34.75 kg/m.  Physical Exam  Constitutional: Patient appears well-developed and well-nourished.   No distress.  HEENT: head atraumatic, normocephalic, pupils equal and reactive to light, neck supple, throat within normal limits Cardiovascular: Normal rate, regular rhythm and normal heart sounds.  No murmur heard. No BLE edema. Pulmonary/Chest: Effort normal and breath sounds normal. No respiratory distress. Abdominal: Soft.  There is no tenderness. Muscular skeletal: no synovitis  Psychiatric: Patient has a normal mood and affect. behavior is normal. Judgment and thought content normal.   Recent Results (from the past 2160 hour(s))  B12 and Folate Panel     Status: None   Collection Time: 11/15/21  2:40 PM  Result Value Ref Range   Vitamin B-12 781 232 - 1,245 pg/mL   Folate >20.0 >3.0 ng/mL  Comment: A serum folate concentration of less than 3.1 ng/mL is considered to represent clinical deficiency.   Fe+TIBC+Fer     Status: Abnormal   Collection Time: 11/15/21  2:40 PM  Result Value Ref Range   Total Iron Binding Capacity 338 250 - 450 ug/dL   UIBC 268 341 - 962 ug/dL   Iron 31 27 - 229 ug/dL   Iron Saturation 9 (LL) 15 - 55 %   Ferritin 52 15 - 150 ng/mL  POCT HgB A1C     Status: Abnormal   Collection Time: 11/26/21  8:18 AM  Result Value Ref Range   Hemoglobin A1C 6.2 (A) 4.0 - 5.6 %   HbA1c POC (<> result, manual entry)     HbA1c, POC (prediabetic range)     HbA1c, POC (controlled diabetic range)       PHQ2/9:    11/26/2021    8:17 AM 07/23/2021    9:21 AM 03/05/2021    8:33 AM 12/02/2020    9:53 AM 11/03/2020    2:51 PM  Depression screen PHQ 2/9  Decreased Interest 0 0 0 0 0  Down, Depressed, Hopeless 0 0 0 0 0  PHQ - 2 Score 0 0 0 0 0  Altered sleeping 0 0 0  0  Tired, decreased energy 0 0 0  0  Change in appetite 0 0 0  0  Feeling bad or failure about yourself  0 0 0  0  Trouble concentrating 0 0 0  0  Moving slowly or fidgety/restless 0 0 0  0  Suicidal thoughts 0 0 0  0  PHQ-9 Score 0 0 0  0  Difficult doing work/chores   Not difficult at all  Not  difficult at all    phq 9 is negative   Fall Risk:    11/26/2021    8:17 AM 07/23/2021    9:20 AM 03/05/2021    8:33 AM 12/02/2020    9:53 AM 11/03/2020    2:51 PM  Fall Risk   Falls in the past year? 0 0 0 0 0  Number falls in past yr: 0  0 0 0  Injury with Fall? 0  0 0 0  Risk for fall due to : No Fall Risks  No Fall Risks    Follow up Falls prevention discussed Falls prevention discussed Falls prevention discussed        Functional Status Survey: Is the patient deaf or have difficulty hearing?: No Does the patient have difficulty seeing, even when wearing glasses/contacts?: No Does the patient have difficulty concentrating, remembering, or making decisions?: No Does the patient have difficulty walking or climbing stairs?: No Does the patient have difficulty dressing or bathing?: No Does the patient have difficulty doing errands alone such as visiting a doctor's office or shopping?: No    Assessment & Plan  1. Type 2 diabetes mellitus with microalbuminuria, without long-term current use of insulin (HCC)  - POCT HgB A1C - Dapagliflozin-metFORMIN HCl ER (XIGDUO XR) 01-999 MG TB24; Take 1 tablet by mouth daily.  Dispense: 90 tablet; Refill: 1  2. Major depression in remission [facility])  Not on medications by choice, reminded her duloxetine may help with pain   3. Inflammatory polyarthritis (HCC)  Under the care of Rheumatologist   4. Hyperparathyroidism (HCC)  Keep follow up with Dr. Gershon Crane   5. Anemia of chronic disease   6. Hypothyroidism, postablative   7. Vitamin D deficiency   8. Dyslipidemia  -  atorvastatin (LIPITOR) 10 MG tablet; Take 1 tablet (10 mg total) by mouth daily.  Dispense: 90 tablet; Refill: 1  9. Dyslipidemia due to type 2 diabetes mellitus (HCC)  - Dapagliflozin-metFORMIN HCl ER (XIGDUO XR) 01-999 MG TB24; Take 1 tablet by mouth daily.  Dispense: 90 tablet; Refill: 1  10. Benign hypertension  - metoprolol succinate (TOPROL-XL) 50  MG 24 hr tablet; Take 1 tablet (50 mg total) by mouth daily. TAKE WITH OR IMMEDIATELY FOLLOWING A MEAL.  Dispense: 90 tablet; Refill: 1 - amLODipine-olmesartan (AZOR) 10-40 MG tablet; Take 1 tablet by mouth daily. In place of Olmesartan and Amlodipine for BP  Dispense: 90 tablet; Refill: 1  11. Gastro-esophageal reflux disease without esophagitis   12. Pharyngeal dysphagia  Unable to have EGD due to cost

## 2021-11-26 ENCOUNTER — Other Ambulatory Visit: Payer: Self-pay | Admitting: Family Medicine

## 2021-11-26 ENCOUNTER — Encounter: Payer: Self-pay | Admitting: Family Medicine

## 2021-11-26 ENCOUNTER — Ambulatory Visit (INDEPENDENT_AMBULATORY_CARE_PROVIDER_SITE_OTHER): Payer: 59 | Admitting: Family Medicine

## 2021-11-26 VITALS — BP 112/76 | HR 80 | Resp 16 | Ht 62.0 in | Wt 190.0 lb

## 2021-11-26 DIAGNOSIS — E559 Vitamin D deficiency, unspecified: Secondary | ICD-10-CM

## 2021-11-26 DIAGNOSIS — M064 Inflammatory polyarthropathy: Secondary | ICD-10-CM

## 2021-11-26 DIAGNOSIS — K219 Gastro-esophageal reflux disease without esophagitis: Secondary | ICD-10-CM

## 2021-11-26 DIAGNOSIS — E1129 Type 2 diabetes mellitus with other diabetic kidney complication: Secondary | ICD-10-CM | POA: Diagnosis not present

## 2021-11-26 DIAGNOSIS — E213 Hyperparathyroidism, unspecified: Secondary | ICD-10-CM | POA: Diagnosis not present

## 2021-11-26 DIAGNOSIS — D638 Anemia in other chronic diseases classified elsewhere: Secondary | ICD-10-CM

## 2021-11-26 DIAGNOSIS — E890 Postprocedural hypothyroidism: Secondary | ICD-10-CM

## 2021-11-26 DIAGNOSIS — I10 Essential (primary) hypertension: Secondary | ICD-10-CM

## 2021-11-26 DIAGNOSIS — E1169 Type 2 diabetes mellitus with other specified complication: Secondary | ICD-10-CM

## 2021-11-26 DIAGNOSIS — E785 Hyperlipidemia, unspecified: Secondary | ICD-10-CM

## 2021-11-26 DIAGNOSIS — R1313 Dysphagia, pharyngeal phase: Secondary | ICD-10-CM

## 2021-11-26 DIAGNOSIS — R809 Proteinuria, unspecified: Secondary | ICD-10-CM

## 2021-11-26 DIAGNOSIS — F325 Major depressive disorder, single episode, in full remission: Secondary | ICD-10-CM

## 2021-11-26 LAB — POCT GLYCOSYLATED HEMOGLOBIN (HGB A1C): Hemoglobin A1C: 6.2 % — AB (ref 4.0–5.6)

## 2021-11-26 MED ORDER — METOPROLOL SUCCINATE ER 50 MG PO TB24: 50.0000 mg | ORAL_TABLET | Freq: Every day | ORAL | 1 refills | Status: DC

## 2021-11-26 MED ORDER — XIGDUO XR 10-1000 MG PO TB24: 1.0000 | ORAL_TABLET | Freq: Every day | ORAL | 1 refills | Status: DC

## 2021-11-26 MED ORDER — AMLODIPINE-OLMESARTAN 10-40 MG PO TABS: 1.0000 | ORAL_TABLET | Freq: Every day | ORAL | 1 refills | Status: DC

## 2021-11-26 MED ORDER — ATORVASTATIN CALCIUM 10 MG PO TABS: 10.0000 mg | ORAL_TABLET | Freq: Every day | ORAL | 1 refills | Status: DC

## 2021-12-13 ENCOUNTER — Other Ambulatory Visit: Payer: Self-pay | Admitting: Family Medicine

## 2021-12-13 DIAGNOSIS — I10 Essential (primary) hypertension: Secondary | ICD-10-CM

## 2022-01-06 ENCOUNTER — Other Ambulatory Visit: Payer: Self-pay | Admitting: Family Medicine

## 2022-01-06 DIAGNOSIS — Z1231 Encounter for screening mammogram for malignant neoplasm of breast: Secondary | ICD-10-CM

## 2022-01-13 ENCOUNTER — Other Ambulatory Visit: Payer: Self-pay | Admitting: Internal Medicine

## 2022-01-13 DIAGNOSIS — E559 Vitamin D deficiency, unspecified: Secondary | ICD-10-CM

## 2022-01-13 NOTE — Telephone Encounter (Signed)
Requested medication (s) are due for refill today: yes  Requested medication (s) are on the active medication list: yes  Last refill: 10/21/21 #12/0  Future visit scheduled: yes  Notes to clinic:  yes     Requested Prescriptions  Pending Prescriptions Disp Refills   Vitamin D, Ergocalciferol, (DRISDOL) 1.25 MG (50000 UNIT) CAPS capsule [Pharmacy Med Name: VITAMIN D2 1.25MG (50,000 UNIT)] 12 capsule 0    Sig: TAKE 1 CAPSULE (50,000 UNITS TOTAL) BY MOUTH EVERY 7 (SEVEN) DAYS     Endocrinology:  Vitamins - Vitamin D Supplementation 2 Failed - 01/13/2022  8:32 AM      Failed - Manual Review: Route requests for 50,000 IU strength to the provider      Failed - Vitamin D in normal range and within 360 days    Vit D, 25-Hydroxy  Date Value Ref Range Status  09/11/2019 33 30 - 100 ng/mL Final    Comment:    Vitamin D Status         25-OH Vitamin D: . Deficiency:                    <20 ng/mL Insufficiency:             20 - 29 ng/mL Optimal:                 > or = 30 ng/mL . For 25-OH Vitamin D testing on patients on  D2-supplementation and patients for whom quantitation  of D2 and D3 fractions is required, the [facility](TM) 25-OH VIT D, (D2,D3), LC/MS/MS is recommended: order  code [id number] (patients >23yrs). See Note 1 . Note 1 . For additional information, please refer to  http://education.[facility].com/faq/FAQ199  (This link is being provided for informational/ educational purposes only.)          Passed - Ca in normal range and within 360 days    Calcium  Date Value Ref Range Status  07/23/2021 9.8 8.6 - 10.4 mg/dL Final   Calcium, Total  Date Value Ref Range Status  02/26/2013 9.4 8.5 - 10.1 mg/dL Final         Passed - Valid encounter within last 12 months    Recent Outpatient Visits           1 month ago Type 2 diabetes mellitus with microalbuminuria, without long-term current use of insulin [facility])   [facility] [place], Drue Stager, MD    5 months ago Type 2 diabetes mellitus with microalbuminuria, without long-term current use of insulin [facility])   [facility] [place], Drue Stager, MD   10 months ago Type 2 diabetes mellitus with microalbuminuria, without long-term current use of insulin [facility])   [facility] [place], Drue Stager, MD   1 year ago Diabetes mellitus type 2 in obese [facility])   [facility] Steele Sizer, MD   1 year ago Abscess of skin of neck   [facility] Delsa Grana, PA-C       Future Appointments             In 2 months Steele Sizer, MD [facility], [facility]

## 2022-01-28 ENCOUNTER — Ambulatory Visit (INDEPENDENT_AMBULATORY_CARE_PROVIDER_SITE_OTHER): Payer: 59

## 2022-01-28 DIAGNOSIS — Z23 Encounter for immunization: Secondary | ICD-10-CM

## 2022-02-11 ENCOUNTER — Ambulatory Visit
Admission: RE | Admit: 2022-02-11 | Discharge: 2022-02-11 | Disposition: A | Discharge status: Home / Self Care | Payer: 59 | Source: Ambulatory Visit | Attending: Family Medicine | Admitting: Family Medicine

## 2022-02-11 DIAGNOSIS — Z1231 Encounter for screening mammogram for malignant neoplasm of breast: Secondary | ICD-10-CM | POA: Insufficient documentation

## 2022-02-14 ENCOUNTER — Other Ambulatory Visit: Payer: Self-pay | Admitting: Family Medicine

## 2022-02-14 ENCOUNTER — Other Ambulatory Visit: Payer: Self-pay

## 2022-02-14 DIAGNOSIS — N6489 Other specified disorders of breast: Secondary | ICD-10-CM

## 2022-02-14 DIAGNOSIS — R928 Other abnormal and inconclusive findings on diagnostic imaging of breast: Secondary | ICD-10-CM

## 2022-03-04 ENCOUNTER — Ambulatory Visit
Admission: RE | Admit: 2022-03-04 | Discharge: 2022-03-04 | Disposition: A | Discharge status: Home / Self Care | Payer: 59 | Source: Ambulatory Visit | Attending: Family Medicine | Admitting: Family Medicine

## 2022-03-04 DIAGNOSIS — R928 Other abnormal and inconclusive findings on diagnostic imaging of breast: Secondary | ICD-10-CM

## 2022-03-04 DIAGNOSIS — N6489 Other specified disorders of breast: Secondary | ICD-10-CM | POA: Insufficient documentation

## 2022-04-01 ENCOUNTER — Ambulatory Visit: Payer: 59 | Admitting: Family Medicine

## 2022-04-20 ENCOUNTER — Other Ambulatory Visit: Payer: Self-pay | Admitting: Family Medicine

## 2022-04-20 DIAGNOSIS — E559 Vitamin D deficiency, unspecified: Secondary | ICD-10-CM

## 2022-05-27 ENCOUNTER — Ambulatory Visit: Payer: 59 | Admitting: Family Medicine

## 2022-06-02 ENCOUNTER — Other Ambulatory Visit: Payer: Self-pay | Admitting: Family Medicine

## 2022-06-02 DIAGNOSIS — E785 Hyperlipidemia, unspecified: Secondary | ICD-10-CM

## 2022-06-02 DIAGNOSIS — E1169 Type 2 diabetes mellitus with other specified complication: Secondary | ICD-10-CM

## 2022-06-02 DIAGNOSIS — E1129 Type 2 diabetes mellitus with other diabetic kidney complication: Secondary | ICD-10-CM

## 2022-06-09 ENCOUNTER — Other Ambulatory Visit: Payer: Self-pay | Admitting: Family Medicine

## 2022-06-09 DIAGNOSIS — E1129 Type 2 diabetes mellitus with other diabetic kidney complication: Secondary | ICD-10-CM

## 2022-06-09 DIAGNOSIS — E1169 Type 2 diabetes mellitus with other specified complication: Secondary | ICD-10-CM

## 2022-06-09 DIAGNOSIS — E785 Hyperlipidemia, unspecified: Secondary | ICD-10-CM

## 2022-06-09 NOTE — Telephone Encounter (Signed)
Appt sch'd for 3.15.2024 and she would like it sent to [facility]

## 2022-07-07 NOTE — Progress Notes (Signed)
Name: Shelia Vasquez   MRN: [id number]    DOB: 11-03-1965   Date:07/08/2022       Progress Note  Subjective  Chief Complaint  Medication Refills  HPI  DMII: diagnosed back in Dec 2018 with hgb of  6.6%,  7% , 7.3 % , 7.5 % 7.1 %  7.6 %, 6.9 % , 6.2 % today it is 6.3 %   . She could not tolerated Rybelsus, Metformin was not enough , she is current on Xigduo without side effects.   She denies polyphagia, polydipsia or polyuria   Urine micro used to be 50 but now back to normal on ARB and also SGL-2 agonist . She also has  obesity, dyslipidemia and takes atorvastatin, we will recheck labs today    HTN: she is on Azor and metoprolol. Denies chest pain, or sob but has noticed palpitation almost weekly than can last up to 20 minutes per episode    Major Depression Mild/Anxiety  : She has a long history of depression - since her teenage years. Symptoms got worse in 2015/08/13 when her father died . She has a 56 yo daughter with  autism and does not like getting out of the house, she states her daughter is still doing virtual school., and graduates in June 2024 She tried Duloxetine and Hydroxizine  in the past but stopped on her own She states she likes to work from home.    Inflammatory arthritis : seen by Rheumatologist  she was given Plaquenil and Methotrexate, eye exam is up to date. She states worse pain is on both ankles and knees, wakes up feeling stiff, no swelling or redness She needs visits    Hypothyroidism:she had level Dec by Dr. Honor Junes she was compliant with medication at the time, currently taking 112 mcg of levothyroxine   Obesity: BMI is now below 35, she has been eating smaller portions Stable    GERD: taking Tums  prn, states occasionally has dysphagia, food gets stuck on upper esophagus - she was seen by GI and was supposed to have an EGD but cancelled procedure due to cot. Symptoms are still present avoiding foods that get stuck   Chronic constipation: she states since on Xigduo  her bowel movements are now daily   Hyperparathyroidism: last calcium back to normal, [place] negative, no symptoms and we will not get CT at this time.  She is off HCTZ, seeing Endo and is now on higher supplementation of vitamin D, she is up to date with visits   Patient Active Problem List   Diagnosis Date Noted   Inflammatory polyarthritis ([place]) 08/31/2020   Positive ANA (antinuclear antibody) 07/26/2017   Benign hypertension 02/06/2015   Dyslipidemia 02/06/2015   Gastro-esophageal reflux disease without esophagitis 02/06/2015   History of radioactive iodine thyroid ablation 02/06/2015   Hypothyroidism, postablative 02/06/2015   History of iron deficiency anemia 02/06/2015   Obesity (BMI 30.0-34.9) 02/06/2015   H/O: hysterectomy 02/06/2015   Vitamin D deficiency 02/06/2015   Seasonal allergic rhinitis 02/06/2015   History of uterine fibroid 05/29/2013    Past Surgical History:  Procedure Laterality Date   ABDOMINAL HYSTERECTOMY     BTL     CESAREAN SECTION     COLONOSCOPY WITH PROPOFOL N/A 03/07/2017   Procedure: COLONOSCOPY WITH PROPOFOL;  Surgeon: Lucilla Lame, MD;  Location: [facility] ENDOSCOPY;  Service: Endoscopy;  Laterality: N/A;   TUBAL LIGATION      Family History  Problem Relation Age of Onset  Arthritis Father    Autism Daughter    Breast cancer Neg Hx     Social History   Tobacco Use   Smoking status: Never   Smokeless tobacco: Never  Substance Use Topics   Alcohol use: No    Alcohol/week: 0.0 standard drinks of alcohol     Current Outpatient Medications:    atorvastatin (LIPITOR) 10 MG tablet, TAKE 1 TABLET BY MOUTH EVERY DAY, Disp: 90 tablet, Rfl: 0   diclofenac Sodium (VOLTAREN) 1 % GEL, Apply 2 g topically 4 (four) times daily., Disp: , Rfl:    fluticasone (FLONASE) 50 MCG/ACT nasal spray, Place 2 sprays into both nostrils daily., Disp: 16 g, Rfl: 5   folic acid (FOLVITE) 1 MG tablet, Take 1 mg by mouth daily., Disp: , Rfl:    hydroxychloroquine  (PLAQUENIL) 200 MG tablet, Take 1 tablet by mouth 2 (two) times daily., Disp: , Rfl:    levothyroxine (SYNTHROID) 125 MCG tablet, Take by mouth., Disp: , Rfl:    loratadine (CLARITIN) 10 MG tablet, Take 1 tablet (10 mg total) by mouth daily., Disp: 100 tablet, Rfl: 1   methotrexate (RHEUMATREX) 2.5 MG tablet, Take 5 tablets by mouth once a week., Disp: , Rfl:    montelukast (SINGULAIR) 10 MG tablet, Take 1 tablet (10 mg total) by mouth at bedtime., Disp: 90 tablet, Rfl: 1   Vitamin D, Ergocalciferol, (DRISDOL) 1.25 MG (50000 UNIT) CAPS capsule, TAKE 1 CAPSULE (50,000 UNITS TOTAL) BY MOUTH EVERY 7 (SEVEN) DAYS, Disp: 12 capsule, Rfl: 0   amLODipine-olmesartan (AZOR) 10-40 MG tablet, Take 1 tablet by mouth daily. In place of Olmesartan and Amlodipine for BP, Disp: 90 tablet, Rfl: 1   Dapagliflozin Pro-metFORMIN ER (XIGDUO XR) 01-999 MG TB24, Take 1 tablet by mouth daily., Disp: 90 tablet, Rfl: 0   metoprolol succinate (TOPROL-XL) 50 MG 24 hr tablet, TAKE 1 TABLET BY MOUTH EVERY DAY WITH OR IMMEDIATELY FOLLOWING A MEAL, Disp: 90 tablet, Rfl: 1  No Known Allergies  I personally reviewed active problem list, medication list, allergies, family history, social history, health maintenance with the patient/caregiver today.   ROS  Constitutional: Negative for fever , positive for mild  weight change.  Respiratory: positive  for cough, chest congestion, but no  shortness of breath.   Cardiovascular: Negative for chest pain but has occasional palpitations that can last 10 minutes   Gastrointestinal: Negative for abdominal pain, no bowel changes.  Musculoskeletal: Negative for gait problem or joint swelling.  Skin: Negative for rash.  Neurological: Negative for dizziness or headache.  No other specific complaints in a complete review of systems (except as listed in HPI above).   Objective  Vitals:   56/15/24 0922  BP: 114/72  Pulse: 77  Resp: 14  Temp: 98 F (36.7 C)  TempSrc: Oral  SpO2: 98%   Weight: 196 lb 4.8 oz (89 kg)  Height: 5' 3.5" (1.613 m)    Body mass index is 34.23 kg/m.  Physical Exam  Constitutional: Patient appears well-developed and well-nourished. Obese  No distress.  HEENT: head atraumatic, normocephalic, pupils equal and reactive to light,, neck supple, throat within normal limits Cardiovascular: Normal rate, regular rhythm and normal heart sounds.  No murmur heard. No BLE edema. Pulmonary/Chest: Effort normal and breath sounds normal. No respiratory distress. Abdominal: Soft.  There is no tenderness. Psychiatric: Patient has a normal mood and affect. behavior is normal. Judgment and thought content normal.   Recent Results (from the past 2160 hour(s))  POCT  HgB A1C     Status: Abnormal   Collection Time: 07/08/22  9:36 AM  Result Value Ref Range   Hemoglobin A1C 6.3 (A) 4.0 - 5.6 %   HbA1c POC (<> result, manual entry)     HbA1c, POC (prediabetic range)     HbA1c, POC (controlled diabetic range)      Diabetic Foot Exam: Diabetic Foot Exam - Simple   Simple Foot Form Visual Inspection No deformities, no ulcerations, no other skin breakdown bilaterally: Yes Sensation Testing Intact to touch and monofilament testing bilaterally: Yes Pulse Check Posterior Tibialis and Dorsalis pulse intact bilaterally: Yes Comments      PHQ2/9:    07/08/2022    9:23 AM 11/26/2021    8:17 AM 07/23/2021    9:21 AM 03/05/2021    8:33 AM 12/02/2020    9:53 AM  Depression screen PHQ 2/9  Decreased Interest 0 0 0 0 0  Down, Depressed, Hopeless 0 0 0 0 0  PHQ - 2 Score 0 0 0 0 0  Altered sleeping 0 0 0 0   Tired, decreased energy 0 0 0 0   Change in appetite 0 0 0 0   Feeling bad or failure about yourself  0 0 0 0   Trouble concentrating 0 0 0 0   Moving slowly or fidgety/restless 0 0 0 0   Suicidal thoughts 0 0 0 0   PHQ-9 Score 0 0 0 0   Difficult doing work/chores    Not difficult at all     phq 9 is negative   Fall Risk:    07/08/2022    9:23 AM  11/26/2021    8:17 AM 07/23/2021    9:20 AM 03/05/2021    8:33 AM 12/02/2020    9:53 AM  Fall Risk   Falls in the past year? 0 0 0 0 0  Number falls in past yr:  0  0 0  Injury with Fall?  0  0 0  Risk for fall due to : No Fall Risks No Fall Risks  No Fall Risks   Follow up Falls prevention discussed Falls prevention discussed Falls prevention discussed Falls prevention discussed       Functional Status Survey: Is the patient deaf or have difficulty hearing?: No Does the patient have difficulty seeing, even when wearing glasses/contacts?: No Does the patient have difficulty concentrating, remembering, or making decisions?: No Does the patient have difficulty walking or climbing stairs?: No Does the patient have difficulty dressing or bathing?: No Does the patient have difficulty doing errands alone such as visiting a doctor's office or shopping?: No    Assessment & Plan   1. Type 2 diabetes mellitus with microalbuminuria, without long-term current use of insulin (HCC)  - POCT HgB A1C - HM Diabetes Foot Exam - Urine Microalbumin w/creat. ratio - COMPLETE METABOLIC PANEL WITH GFR - Dapagliflozin Pro-metFORMIN ER (XIGDUO XR) 01-999 MG TB24; Take 1 tablet by mouth daily.  Dispense: 90 tablet; Refill: 0  2. Dyslipidemia due to type 2 diabetes mellitus (HCC)  - Lipid panel - Dapagliflozin Pro-metFORMIN ER (XIGDUO XR) 01-999 MG TB24; Take 1 tablet by mouth daily.  Dispense: 90 tablet; Refill: 0  3. Hyperparathyroidism ([place])  Keep follow up with Endo  4. Inflammatory polyarthritis ([place])  Reminded to contact and schedule visit with Rheumatologist and get eye exam  5. Morbid obesity ([place])  Discussed with the patient the risk posed by an increased BMI. Discussed importance of portion  control, calorie counting and at least 150 minutes of physical activity weekly. Avoid sweet beverages and drink more water. Eat at least 6 servings of fruit and vegetables daily    6. Major  depression in remission (HCC)  Stable   7. Hypothyroidism, postablative  Under the care of Endo   8. Palpitation  - LONG TERM MONITOR (3-14 DAYS); Future  9. Anemia of chronic disease  Recheck labs  10. Vitamin D deficiency  Getting supplements from Dr. Honor Junes  11. Benign hypertension  Towards low end of normal but she prefers not changing the dose at this time  - amLODipine-olmesartan (AZOR) 10-40 MG tablet; Take 1 tablet by mouth daily. In place of Olmesartan and Amlodipine for BP  Dispense: 90 tablet; Refill: 1 - metoprolol succinate (TOPROL-XL) 50 MG 24 hr tablet; TAKE 1 TABLET BY MOUTH EVERY DAY WITH OR IMMEDIATELY FOLLOWING A MEAL  Dispense: 90 tablet; Refill: 1  12. Gastro-esophageal reflux disease without esophagitis  Unchanged, cannot afford EGD  13. GAD (generalized anxiety disorder)  stable  14. Subacute cough  - CBC with Differential/Platelet Normal exam, we will check CBC and start allergy treatment if no improvement we will check CXR   15. Perennial allergic rhinitis with seasonal variation  - montelukast (SINGULAIR) 10 MG tablet; Take 1 tablet (10 mg total) by mouth at bedtime.  Dispense: 90 tablet; Refill: 1 - loratadine (CLARITIN) 10 MG tablet; Take 1 tablet (10 mg total) by mouth daily.  Dispense: 100 tablet; Refill: 1

## 2022-07-08 ENCOUNTER — Ambulatory Visit: Payer: 59 | Attending: Family Medicine

## 2022-07-08 ENCOUNTER — Encounter: Payer: Self-pay | Admitting: Family Medicine

## 2022-07-08 ENCOUNTER — Ambulatory Visit (INDEPENDENT_AMBULATORY_CARE_PROVIDER_SITE_OTHER): Payer: 59 | Admitting: Family Medicine

## 2022-07-08 VITALS — BP 114/72 | HR 77 | Temp 98.0°F | Resp 14 | Ht 63.5 in | Wt 196.3 lb

## 2022-07-08 DIAGNOSIS — R809 Proteinuria, unspecified: Secondary | ICD-10-CM | POA: Diagnosis not present

## 2022-07-08 DIAGNOSIS — I10 Essential (primary) hypertension: Secondary | ICD-10-CM

## 2022-07-08 DIAGNOSIS — E1169 Type 2 diabetes mellitus with other specified complication: Secondary | ICD-10-CM | POA: Diagnosis not present

## 2022-07-08 DIAGNOSIS — E1129 Type 2 diabetes mellitus with other diabetic kidney complication: Secondary | ICD-10-CM

## 2022-07-08 DIAGNOSIS — M064 Inflammatory polyarthropathy: Secondary | ICD-10-CM | POA: Diagnosis not present

## 2022-07-08 DIAGNOSIS — K219 Gastro-esophageal reflux disease without esophagitis: Secondary | ICD-10-CM

## 2022-07-08 DIAGNOSIS — E890 Postprocedural hypothyroidism: Secondary | ICD-10-CM

## 2022-07-08 DIAGNOSIS — E213 Hyperparathyroidism, unspecified: Secondary | ICD-10-CM | POA: Diagnosis not present

## 2022-07-08 DIAGNOSIS — R002 Palpitations: Secondary | ICD-10-CM

## 2022-07-08 DIAGNOSIS — J302 Other seasonal allergic rhinitis: Secondary | ICD-10-CM

## 2022-07-08 DIAGNOSIS — F411 Generalized anxiety disorder: Secondary | ICD-10-CM

## 2022-07-08 DIAGNOSIS — E785 Hyperlipidemia, unspecified: Secondary | ICD-10-CM

## 2022-07-08 DIAGNOSIS — D638 Anemia in other chronic diseases classified elsewhere: Secondary | ICD-10-CM

## 2022-07-08 DIAGNOSIS — E559 Vitamin D deficiency, unspecified: Secondary | ICD-10-CM

## 2022-07-08 DIAGNOSIS — F325 Major depressive disorder, single episode, in full remission: Secondary | ICD-10-CM

## 2022-07-08 DIAGNOSIS — R052 Subacute cough: Secondary | ICD-10-CM

## 2022-07-08 DIAGNOSIS — J3089 Other allergic rhinitis: Secondary | ICD-10-CM

## 2022-07-08 LAB — POCT GLYCOSYLATED HEMOGLOBIN (HGB A1C): Hemoglobin A1C: 6.3 % — AB (ref 4.0–5.6)

## 2022-07-08 MED ORDER — METOPROLOL SUCCINATE ER 50 MG PO TB24: ORAL_TABLET | ORAL | 1 refills | Status: DC

## 2022-07-08 MED ORDER — MONTELUKAST SODIUM 10 MG PO TABS: 10.0000 mg | ORAL_TABLET | Freq: Every day | ORAL | 1 refills | Status: DC

## 2022-07-08 MED ORDER — AMLODIPINE-OLMESARTAN 10-40 MG PO TABS: 1.0000 | ORAL_TABLET | Freq: Every day | ORAL | 1 refills | Status: DC

## 2022-07-08 MED ORDER — LORATADINE 10 MG PO TABS: 10.0000 mg | ORAL_TABLET | Freq: Every day | ORAL | 1 refills | Status: DC

## 2022-07-08 MED ORDER — DAPAGLIFLOZIN PRO-METFORMIN ER 10-1000 MG PO TB24: 1.0000 | ORAL_TABLET | Freq: Every day | ORAL | 0 refills | Status: DC

## 2022-07-09 LAB — CBC WITH DIFFERENTIAL/PLATELET
Absolute Monocytes: 558 cells/uL (ref 200–950)
Basophils Absolute: 61 cells/uL (ref 0–200)
Basophils Relative: 0.9 %
Eosinophils Absolute: 163 cells/uL (ref 15–500)
Eosinophils Relative: 2.4 %
HCT: 36.6 % (ref 35.0–45.0)
Hemoglobin: 11.9 g/dL (ref 11.7–15.5)
Lymphs Abs: 2210 cells/uL (ref 850–3900)
MCH: 25.3 pg — ABNORMAL LOW (ref 27.0–33.0)
MCHC: 32.5 g/dL (ref 32.0–36.0)
MCV: 77.7 fL — ABNORMAL LOW (ref 80.0–100.0)
MPV: 10 fL (ref 7.5–12.5)
Monocytes Relative: 8.2 %
Neutro Abs: 3808 cells/uL (ref 1500–7800)
Neutrophils Relative %: 56 %
Platelets: 410 10*3/uL — ABNORMAL HIGH (ref 140–400)
RBC: 4.71 10*6/uL (ref 3.80–5.10)
RDW: 14.9 % (ref 11.0–15.0)
Total Lymphocyte: 32.5 %
WBC: 6.8 10*3/uL (ref 3.8–10.8)

## 2022-07-09 LAB — COMPLETE METABOLIC PANEL WITH GFR
AG Ratio: 1.3 (calc) (ref 1.0–2.5)
ALT: 10 U/L (ref 6–29)
AST: 13 U/L (ref 10–35)
Albumin: 4.3 g/dL (ref 3.6–5.1)
Alkaline phosphatase (APISO): 91 U/L (ref 37–153)
BUN: 13 mg/dL (ref 7–25)
CO2: 25 mmol/L (ref 20–32)
Calcium: 10 mg/dL (ref 8.6–10.4)
Chloride: 104 mmol/L (ref 98–110)
Creat: 0.64 mg/dL (ref 0.50–1.03)
Globulin: 3.4 g/dL (calc) (ref 1.9–3.7)
Glucose, Bld: 92 mg/dL (ref 65–99)
Potassium: 4.1 mmol/L (ref 3.5–5.3)
Sodium: 141 mmol/L (ref 135–146)
Total Bilirubin: 0.3 mg/dL (ref 0.2–1.2)
Total Protein: 7.7 g/dL (ref 6.1–8.1)
eGFR: 104 mL/min/{1.73_m2} (ref >=60)

## 2022-07-09 LAB — LIPID PANEL
Cholesterol: 199 mg/dL (ref <200)
HDL: 121 mg/dL (ref >=50)
LDL Cholesterol (Calc): 65 mg/dL (calc)
Non-HDL Cholesterol (Calc): 78 mg/dL (calc) (ref <130)
Total CHOL/HDL Ratio: 1.6 (calc) (ref <5.0)
Triglycerides: 58 mg/dL (ref <150)

## 2022-07-09 LAB — MICROALBUMIN / CREATININE URINE RATIO
Creatinine, Urine: 68 mg/dL (ref 20–275)
Microalb Creat Ratio: 13 mcg/mg creat (ref <30)
Microalb, Ur: 0.9 mg/dL

## 2022-07-13 DIAGNOSIS — R002 Palpitations: Secondary | ICD-10-CM | POA: Diagnosis not present

## 2022-09-01 ENCOUNTER — Other Ambulatory Visit: Payer: Self-pay | Admitting: Family Medicine

## 2022-09-01 DIAGNOSIS — E785 Hyperlipidemia, unspecified: Secondary | ICD-10-CM

## 2022-10-14 ENCOUNTER — Encounter: Payer: 59 | Admitting: Family Medicine

## 2022-10-19 ENCOUNTER — Other Ambulatory Visit: Payer: Self-pay | Admitting: Family Medicine

## 2022-10-19 DIAGNOSIS — E785 Hyperlipidemia, unspecified: Secondary | ICD-10-CM

## 2022-12-11 ENCOUNTER — Other Ambulatory Visit: Payer: Self-pay | Admitting: Family Medicine

## 2022-12-11 DIAGNOSIS — E1129 Type 2 diabetes mellitus with other diabetic kidney complication: Secondary | ICD-10-CM

## 2022-12-11 DIAGNOSIS — E1169 Type 2 diabetes mellitus with other specified complication: Secondary | ICD-10-CM

## 2022-12-20 ENCOUNTER — Other Ambulatory Visit: Payer: Self-pay | Admitting: Family Medicine

## 2022-12-20 DIAGNOSIS — I10 Essential (primary) hypertension: Secondary | ICD-10-CM

## 2023-01-09 ENCOUNTER — Ambulatory Visit: Payer: 59 | Admitting: Family Medicine

## 2023-01-15 ENCOUNTER — Other Ambulatory Visit: Payer: Self-pay | Admitting: Family Medicine

## 2023-01-15 DIAGNOSIS — E785 Hyperlipidemia, unspecified: Secondary | ICD-10-CM

## 2023-01-15 DIAGNOSIS — E1129 Type 2 diabetes mellitus with other diabetic kidney complication: Secondary | ICD-10-CM

## 2023-02-24 ENCOUNTER — Ambulatory Visit (INDEPENDENT_AMBULATORY_CARE_PROVIDER_SITE_OTHER): Payer: 59 | Admitting: Family Medicine

## 2023-02-24 ENCOUNTER — Encounter: Payer: Self-pay | Admitting: Family Medicine

## 2023-02-24 VITALS — BP 116/70 | HR 71 | Temp 98.1°F | Resp 14 | Ht 63.0 in | Wt 200.0 lb

## 2023-02-24 DIAGNOSIS — J302 Other seasonal allergic rhinitis: Secondary | ICD-10-CM

## 2023-02-24 DIAGNOSIS — M064 Inflammatory polyarthropathy: Secondary | ICD-10-CM

## 2023-02-24 DIAGNOSIS — E1169 Type 2 diabetes mellitus with other specified complication: Secondary | ICD-10-CM

## 2023-02-24 DIAGNOSIS — E1129 Type 2 diabetes mellitus with other diabetic kidney complication: Secondary | ICD-10-CM | POA: Diagnosis not present

## 2023-02-24 DIAGNOSIS — E890 Postprocedural hypothyroidism: Secondary | ICD-10-CM

## 2023-02-24 DIAGNOSIS — F325 Major depressive disorder, single episode, in full remission: Secondary | ICD-10-CM

## 2023-02-24 DIAGNOSIS — J3089 Other allergic rhinitis: Secondary | ICD-10-CM

## 2023-02-24 DIAGNOSIS — E785 Hyperlipidemia, unspecified: Secondary | ICD-10-CM

## 2023-02-24 DIAGNOSIS — I10 Essential (primary) hypertension: Secondary | ICD-10-CM

## 2023-02-24 DIAGNOSIS — E213 Hyperparathyroidism, unspecified: Secondary | ICD-10-CM

## 2023-02-24 DIAGNOSIS — Z23 Encounter for immunization: Secondary | ICD-10-CM | POA: Diagnosis not present

## 2023-02-24 LAB — POCT GLYCOSYLATED HEMOGLOBIN (HGB A1C): Hemoglobin A1C: 6.8 % — AB (ref 4.0–5.6)

## 2023-02-24 MED ORDER — LORATADINE 10 MG PO TABS: 10.0000 mg | ORAL_TABLET | Freq: Every day | ORAL | 1 refills | Status: DC

## 2023-02-24 MED ORDER — MONTELUKAST SODIUM 10 MG PO TABS
10.0000 mg | ORAL_TABLET | Freq: Every day | ORAL | 1 refills | Status: DC
Start: 2023-02-24 — End: 2023-12-06

## 2023-02-24 MED ORDER — FLUTICASONE PROPIONATE 50 MCG/ACT NA SUSP: 2.0000 | Freq: Every day | NASAL | 1 refills | Status: AC

## 2023-02-24 MED ORDER — METOPROLOL SUCCINATE ER 50 MG PO TB24
ORAL_TABLET | ORAL | 1 refills | Status: DC
Start: 2023-02-24 — End: 2023-09-25

## 2023-02-24 MED ORDER — ATORVASTATIN CALCIUM 10 MG PO TABS: 10.0000 mg | ORAL_TABLET | Freq: Every day | ORAL | 1 refills | Status: DC

## 2023-02-24 MED ORDER — DAPAGLIFLOZIN PRO-METFORMIN ER 10-1000 MG PO TB24
1.0000 | ORAL_TABLET | Freq: Every day | ORAL | 1 refills | Status: DC
Start: 2023-02-24 — End: 2023-09-25

## 2023-02-24 MED ORDER — AMLODIPINE-OLMESARTAN 10-40 MG PO TABS: 1.0000 | ORAL_TABLET | Freq: Every day | ORAL | 1 refills | Status: DC

## 2023-05-30 ENCOUNTER — Ambulatory Visit (INDEPENDENT_AMBULATORY_CARE_PROVIDER_SITE_OTHER): Payer: 59 | Admitting: Family Medicine

## 2023-05-30 VITALS — BP 128/72 | HR 69 | Temp 97.9°F | Resp 16 | Ht 63.0 in | Wt 199.2 lb

## 2023-05-30 DIAGNOSIS — E1129 Type 2 diabetes mellitus with other diabetic kidney complication: Secondary | ICD-10-CM | POA: Diagnosis not present

## 2023-05-30 DIAGNOSIS — E785 Hyperlipidemia, unspecified: Secondary | ICD-10-CM

## 2023-05-30 DIAGNOSIS — E213 Hyperparathyroidism, unspecified: Secondary | ICD-10-CM

## 2023-05-30 DIAGNOSIS — E1169 Type 2 diabetes mellitus with other specified complication: Secondary | ICD-10-CM

## 2023-05-30 DIAGNOSIS — F325 Major depressive disorder, single episode, in full remission: Secondary | ICD-10-CM

## 2023-05-30 DIAGNOSIS — M064 Inflammatory polyarthropathy: Secondary | ICD-10-CM

## 2023-05-30 DIAGNOSIS — E890 Postprocedural hypothyroidism: Secondary | ICD-10-CM

## 2023-05-30 DIAGNOSIS — R809 Proteinuria, unspecified: Secondary | ICD-10-CM

## 2023-05-30 DIAGNOSIS — Z7984 Long term (current) use of oral hypoglycemic drugs: Secondary | ICD-10-CM

## 2023-05-30 LAB — POCT GLYCOSYLATED HEMOGLOBIN (HGB A1C): Hemoglobin A1C: 6.6 % — AB (ref 4.0–5.6)

## 2023-05-30 MED ORDER — TIRZEPATIDE 2.5 MG/0.5ML ~~LOC~~ SOAJ: 2.5000 mg | SUBCUTANEOUS | 0 refills | Status: DC

## 2023-05-30 NOTE — Progress Notes (Signed)
 Name: Shelia Vasquez   MRN: [id number]    DOB: 01/17/1966   Date:05/30/2023       Progress Note  Subjective  Chief Complaint  Chief Complaint  Patient presents with   Medical Management of Chronic Issues   HPI   DMII: diagnosed back in 12/07/2018with hgb of  6.6%,  7% , 7.3 % , 7.5 % 7.1 %  7.6 %, 6.9 % , 6.2 % , 6.3 %, 6.8 %.and today is down to 6.6 %  She could not tolerated Rybelsus , Metformin  was not enough , she is current on Xigduo  without side effects.   She denies polyphagia, polydipsia or polyuria   Urine micro used to be 50 but now back to normal on ARB and also SGL-2 agonist . She also has  obesity and needs to work on weight loss, She takes Atorvastatin  for  dyslipidemia She is interested in trying an injectable GLP-1 agonist today to get A1C lower    HTN: she is on Azor  and metoprolol . Denies chest pain, palpitation is very sporadic now , denies sob BP is at goal    Major Depression Mild/Anxiety  : She has a long history of depression - since her teenage years. Symptoms got worse in 2016-03-31 when her father died . She has a 57 yo daughter with  autism and does not like getting out of the house, she states her daughter is still doing virtual school and graduates in June 2024. She tried Duloxetine  and Hydroxizine in the past but stopped on her own over one year ago  She states she likes to work from home and is doing well    Inflammatory arthritis : seen by Rheumatologist  she was given Plaquenil and Methotrexate but has been doing well and is now off medications. She also has OA. She states pain has been controlled lately, occasionally has some aches and pains but resolves with otc medication.    Hypothyroidism post-ablative : currently taking 112 mcg of levothyroxine  . Last TSH was at goal , managed by Endo - Dr. Cherilyn . She is up to date with visits    Morbid Obesity: BMI is above 35 again , she has been eating smaller portions, she had to stop Rybelsus  due to side effects.  However weight is down one pound even with the holidays and winter months. She is trying to cut down on portion size. She is willing to try GLP-1 agonist again    GERD: taking Tums  prn, states occasionally has dysphagia, food gets stuck on upper esophagus - she was seen by GI and was supposed to have an EGD but cancelled due to cost. Symptoms are still present avoiding foods that get stuck. She has been chewing more to avoid symptoms. Explained importance of having procedure done, however she states too costly , discussed getting an insurance with lower deductible for next year    Hyperparathyroidism: last calcium  back to normal, [place]  negative  She is off HCTZ, seeing Endo and is now on higher supplementation of vitamin D  and last calcium  was normal. Unchanged   Patient Active Problem List   Diagnosis Date Noted   Inflammatory polyarthritis (HCC) 08/31/2020   Positive ANA (antinuclear antibody) 07/26/2017   Benign hypertension 02/06/2015   Dyslipidemia 02/06/2015   Gastro-esophageal reflux disease without esophagitis 02/06/2015   History of radioactive iodine thyroid  ablation 02/06/2015   Hypothyroidism, postablative 02/06/2015   History of iron deficiency anemia 02/06/2015   Obesity (BMI 30.0-34.9) 02/06/2015  H/O: hysterectomy 02/06/2015   Vitamin D  deficiency 02/06/2015   Seasonal allergic rhinitis 02/06/2015   History of uterine fibroid 05/29/2013    Past Surgical History:  Procedure Laterality Date   ABDOMINAL HYSTERECTOMY     BTL     CESAREAN SECTION     COLONOSCOPY WITH PROPOFOL  N/A 03/07/2017   Procedure: COLONOSCOPY WITH PROPOFOL ;  Surgeon: Jinny Carmine, MD;  Location: [facility] ENDOSCOPY;  Service: Endoscopy;  Laterality: N/A;   TUBAL LIGATION      Family History  Problem Relation Age of Onset   Arthritis Father    Autism Daughter    Breast cancer Neg Hx     Social History   Tobacco Use   Smoking status: Never   Smokeless tobacco: Never  Substance Use Topics    Alcohol use: No    Alcohol/week: 0.0 standard drinks of alcohol     Current Outpatient Medications:    amLODipine -olmesartan  (AZOR ) 10-40 MG tablet, Take 1 tablet by mouth daily. In place of Olmesartan  and Amlodipine  for BP, Disp: 90 tablet, Rfl: 1   atorvastatin  (LIPITOR) 10 MG tablet, Take 1 tablet (10 mg total) by mouth daily., Disp: 90 tablet, Rfl: 1   Dapagliflozin  Pro-metFORMIN  ER (XIGDUO  XR) 01-999 MG TB24, Take 1 tablet by mouth daily., Disp: 90 tablet, Rfl: 1   fluticasone  (FLONASE ) 50 MCG/ACT nasal spray, Place 2 sprays into both nostrils daily., Disp: 48 g, Rfl: 1   levothyroxine  (SYNTHROID ) 112 MCG tablet, Take 112 mcg by mouth daily before breakfast., Disp: , Rfl:    loratadine  (CLARITIN ) 10 MG tablet, Take 1 tablet (10 mg total) by mouth daily., Disp: 100 tablet, Rfl: 1   metoprolol  succinate (TOPROL -XL) 50 MG 24 hr tablet, TAKE 1 TABLET BY MOUTH EVERY DAY WITH OR IMMEDIATELY FOLLOWING A MEAL, Disp: 90 tablet, Rfl: 1   montelukast  (SINGULAIR ) 10 MG tablet, Take 1 tablet (10 mg total) by mouth at bedtime., Disp: 90 tablet, Rfl: 1   Vitamin D , Ergocalciferol , (DRISDOL ) 1.25 MG (50000 UNIT) CAPS capsule, Take 50,000 Units by mouth 2 (two) times a week., Disp: , Rfl:   No Known Allergies  I personally reviewed active problem list, medication list, allergies, family history with the patient/caregiver today.   ROS  Ten systems reviewed and is negative except as mentioned in HPI    Objective  Vitals:   05/30/23 0819  BP: 128/72  Pulse: 69  Resp: 16  Temp: 97.9 F (36.6 C)  TempSrc: Oral  SpO2: 98%  Weight: 199 lb 3.2 oz (90.4 kg)  Height: 5' 3 (1.6 m)    Body mass index is 35.29 kg/m.  Physical Exam  Constitutional: Patient appears well-developed and well-nourished. Obese  No distress.  HEENT: head atraumatic, normocephalic, pupils equal and reactive to light, neck supple Cardiovascular: Normal rate, regular rhythm and normal heart sounds.  No murmur heard. No  BLE edema. Pulmonary/Chest: Effort normal and breath sounds normal. No respiratory distress. Abdominal: Soft.  There is no tenderness. Psychiatric: Patient has a normal mood and affect. behavior is normal. Judgment and thought content normal.   Recent Results (from the past 2160 hours)  POCT glycosylated hemoglobin (Hb A1C)     Status: Abnormal   Collection Time: 05/30/23  8:25 AM  Result Value Ref Range   Hemoglobin A1C 6.6 (A) 4.0 - 5.6 %   HbA1c POC (<> result, manual entry)     HbA1c, POC (prediabetic range)     HbA1c, POC (controlled diabetic range)  Diabetic Foot Exam:     PHQ2/9:    05/30/2023    8:13 AM 02/24/2023    2:49 PM 07/08/2022    9:23 AM 11/26/2021    8:17 AM 07/23/2021    9:21 AM  Depression screen PHQ 2/9  Decreased Interest 0 0 0 0 0  Down, Depressed, Hopeless 0 0 0 0 0  PHQ - 2 Score 0 0 0 0 0  Altered sleeping 0 0 0 0 0  Tired, decreased energy 0 0 0 0 0  Change in appetite 0 0 0 0 0  Feeling bad or failure about yourself  0 0 0 0 0  Trouble concentrating 0 0 0 0 0  Moving slowly or fidgety/restless 0 0 0 0 0  Suicidal thoughts 0 0 0 0 0  PHQ-9 Score 0 0 0 0 0  Difficult doing work/chores Not difficult at all        phq 9 is negative  Fall Risk:    05/30/2023    8:13 AM 02/24/2023    2:49 PM 07/08/2022    9:23 AM 11/26/2021    8:17 AM 07/23/2021    9:20 AM  Fall Risk   Falls in the past year? 0 0 0 0 0  Number falls in past yr: 0   0   Injury with Fall? 0   0   Risk for fall due to : No Fall Risks No Fall Risks No Fall Risks No Fall Risks   Follow up Falls prevention discussed;Education provided;Falls evaluation completed Falls prevention discussed Falls prevention discussed Falls prevention discussed Falls prevention discussed     Assessment & Plan  1. Dyslipidemia due to type 2 diabetes mellitus (HCC) ([place])  - POCT glycosylated hemoglobin (Hb A1C)  2. Morbid obesity (HCC)  Discussed with the patient the risk posed by an increased  BMI. Discussed importance of portion control, calorie counting and at least 150 minutes of physical activity weekly. Avoid sweet beverages and drink more water. Eat at least 6 servings of fruit and vegetables daily    3. Type 2 diabetes mellitus with microalbuminuria, without long-term current use of insulin (HCC)  Continue ARB and SGL-2 agonist  4. Hyperparathyroidism (HCC)  Under the care of Endo   5. Inflammatory polyarthritis (HCC)  Seen by Rheumatologist , doing well at this time  6. Major depression in remission [facility])  We will continue to monitor   7. Hypothyroidism, postablative  Compliant with medications

## 2023-05-31 ENCOUNTER — Encounter: Payer: Self-pay | Admitting: Family Medicine

## 2023-05-31 ENCOUNTER — Other Ambulatory Visit: Payer: Self-pay | Admitting: Family Medicine

## 2023-06-29 ENCOUNTER — Encounter: Payer: Self-pay | Admitting: Family Medicine

## 2023-07-06 ENCOUNTER — Encounter: Payer: Self-pay | Admitting: Family Medicine

## 2023-08-20 ENCOUNTER — Other Ambulatory Visit: Payer: Self-pay | Admitting: Family Medicine

## 2023-08-20 DIAGNOSIS — E785 Hyperlipidemia, unspecified: Secondary | ICD-10-CM

## 2023-08-30 ENCOUNTER — Other Ambulatory Visit: Payer: Self-pay | Admitting: Family Medicine

## 2023-08-30 DIAGNOSIS — Z1231 Encounter for screening mammogram for malignant neoplasm of breast: Secondary | ICD-10-CM

## 2023-09-08 ENCOUNTER — Ambulatory Visit: Payer: 59 | Admitting: Family Medicine

## 2023-09-10 ENCOUNTER — Other Ambulatory Visit: Payer: Self-pay | Admitting: Family Medicine

## 2023-09-10 DIAGNOSIS — I10 Essential (primary) hypertension: Secondary | ICD-10-CM

## 2023-09-10 DIAGNOSIS — E1129 Type 2 diabetes mellitus with other diabetic kidney complication: Secondary | ICD-10-CM

## 2023-09-12 ENCOUNTER — Telehealth: Payer: Self-pay | Admitting: Family Medicine

## 2023-09-12 DIAGNOSIS — I10 Essential (primary) hypertension: Secondary | ICD-10-CM

## 2023-09-12 DIAGNOSIS — E1129 Type 2 diabetes mellitus with other diabetic kidney complication: Secondary | ICD-10-CM

## 2023-09-12 NOTE — Telephone Encounter (Signed)
 Pt has 1 week worth of amlodipine . She has scheduled for the first week in June and would like to know if you would call her enough in until her appt. She is not able to come in sooner due to having to give her job a 2 week notice to be off. Please send to [facility]

## 2023-09-13 MED ORDER — AMLODIPINE-OLMESARTAN 10-40 MG PO TABS: 1.0000 | ORAL_TABLET | Freq: Every day | ORAL | 0 refills | Status: DC

## 2023-09-13 NOTE — Telephone Encounter (Signed)
 Left detailed vm

## 2023-09-20 ENCOUNTER — Encounter

## 2023-09-25 ENCOUNTER — Encounter: Payer: Self-pay | Admitting: Family Medicine

## 2023-09-25 ENCOUNTER — Ambulatory Visit: Admitting: Family Medicine

## 2023-09-25 VITALS — BP 128/82 | HR 81 | Resp 16 | Ht 63.0 in | Wt 197.6 lb

## 2023-09-25 DIAGNOSIS — J302 Other seasonal allergic rhinitis: Secondary | ICD-10-CM

## 2023-09-25 DIAGNOSIS — E1169 Type 2 diabetes mellitus with other specified complication: Secondary | ICD-10-CM

## 2023-09-25 DIAGNOSIS — E213 Hyperparathyroidism, unspecified: Secondary | ICD-10-CM

## 2023-09-25 DIAGNOSIS — E785 Hyperlipidemia, unspecified: Secondary | ICD-10-CM

## 2023-09-25 DIAGNOSIS — Z23 Encounter for immunization: Secondary | ICD-10-CM

## 2023-09-25 DIAGNOSIS — E1129 Type 2 diabetes mellitus with other diabetic kidney complication: Secondary | ICD-10-CM | POA: Diagnosis not present

## 2023-09-25 DIAGNOSIS — R809 Proteinuria, unspecified: Secondary | ICD-10-CM

## 2023-09-25 DIAGNOSIS — I10 Essential (primary) hypertension: Secondary | ICD-10-CM

## 2023-09-25 DIAGNOSIS — J3089 Other allergic rhinitis: Secondary | ICD-10-CM

## 2023-09-25 DIAGNOSIS — M064 Inflammatory polyarthropathy: Secondary | ICD-10-CM

## 2023-09-25 DIAGNOSIS — Z7984 Long term (current) use of oral hypoglycemic drugs: Secondary | ICD-10-CM

## 2023-09-25 LAB — POCT GLYCOSYLATED HEMOGLOBIN (HGB A1C): Hemoglobin A1C: 8.5 % — AB (ref 4.0–5.6)

## 2023-09-25 MED ORDER — PIOGLITAZONE HCL 15 MG PO TABS
15.0000 mg | ORAL_TABLET | Freq: Every day | ORAL | 1 refills | Status: AC
Start: 2023-09-25 — End: ?

## 2023-09-25 MED ORDER — METOPROLOL SUCCINATE ER 50 MG PO TB24: ORAL_TABLET | ORAL | 1 refills | Status: DC

## 2023-09-25 MED ORDER — METFORMIN HCL ER 750 MG PO TB24: 1500.0000 mg | ORAL_TABLET | Freq: Every day | ORAL | 1 refills | Status: DC

## 2023-09-25 MED ORDER — ATORVASTATIN CALCIUM 10 MG PO TABS: 10.0000 mg | ORAL_TABLET | Freq: Every day | ORAL | 1 refills | Status: DC

## 2023-09-25 MED ORDER — AMLODIPINE-OLMESARTAN 10-40 MG PO TABS: 1.0000 | ORAL_TABLET | Freq: Every day | ORAL | 1 refills | Status: DC

## 2023-09-25 NOTE — Progress Notes (Signed)
 Name: Shelia Vasquez   MRN: [id number]    DOB: 1965-06-15   Date:09/25/2023       Progress Note  Subjective  Chief Complaint  Chief Complaint  Patient presents with   Medical Management of Chronic Issues   Discussed the use of AI scribe software for clinical note transcription with the patient, who gave verbal consent to proceed.  History of Present Illness  DMII: diagnosed back in Dec 2018 with hgb of  6.6%. Last level was  6.6 % back in Dec but today is up to 8.5%. She has been out of Xigduo  due to high deductible plan. She asked to switch to a cheaper medication today.    She denies polyphagia, polydipsia or polyuria   Urine micro used to be 50 but now back to normal on ARB and also SGL-2 agonist . She also has  obesity and needs to work on weight loss, She takes Atorvastatin  for  dyslipidemia    HTN: she is on Azor  and metoprolol . Denies chest pain, palpitation is very sporadic now , she has not been checking bp at home, but today bp is at goal    Major Depression Mild/Anxiety  : She has a long history of depression - since her teenage years. Symptoms got worse in October 19, 2015 when her father died . She has a daughter with  autism and does not like getting out of the house. She tried Duloxetine  and Hydroxizine in the past but stopped on her own over one year ago  She states she likes to work from home and that has helped her emotionally    Inflammatory arthritis : seen by Rheumatologist  she was given Plaquenil and Methotrexate but has been doing well and is now off medications. She also has OA. She states pain has been controlled lately, occasionally has some aches and pains but resolves with otc medication. Unchanged   Hypothyroidism post-ablative : currently taking 112 mcg of levothyroxine  . Last TSH was at goal , managed by Endo - Dr. Shelvy Dickens .last level at goal    Morbid Obesity: BMI is above 35 and she has co morbidities such as HTN/dyslipidemia dn DM.  Due to diabetes being out of  control reminded her to be active after meals and cut down on carbohydrates   Hyperparathyroidism: last calcium  back to normal, US  negative  She is off HCTZ, seeing Endo and is now on higher supplementation of vitamin D  and last calcium  was normal.      Patient Active Problem List   Diagnosis Date Noted   Inflammatory polyarthritis (HCC) 08/31/2020   Positive ANA (antinuclear antibody) 07/26/2017   Benign hypertension 02/06/2015   Dyslipidemia 02/06/2015   Gastro-esophageal reflux disease without esophagitis 02/06/2015   History of radioactive iodine thyroid  ablation 02/06/2015   Hypothyroidism, postablative 02/06/2015   History of iron deficiency anemia 02/06/2015   Obesity (BMI 30.0-34.9) 02/06/2015   H/O: hysterectomy 02/06/2015   Vitamin D  deficiency 02/06/2015   Seasonal allergic rhinitis 02/06/2015   History of uterine fibroid 05/29/2013    Past Surgical History:  Procedure Laterality Date   ABDOMINAL HYSTERECTOMY     BTL     CESAREAN SECTION     COLONOSCOPY WITH PROPOFOL  N/A 03/07/2017   Procedure: COLONOSCOPY WITH PROPOFOL ;  Surgeon: Marnee Sink, MD;  Location: [facility] ENDOSCOPY;  Service: Endoscopy;  Laterality: N/A;   TUBAL LIGATION      Family History  Problem Relation Age of Onset   Arthritis Father    Autism Daughter  Breast cancer Neg Hx     Social History   Tobacco Use   Smoking status: Never   Smokeless tobacco: Never  Substance Use Topics   Alcohol use: No    Alcohol/week: 0.0 standard drinks of alcohol     Current Outpatient Medications:    fluticasone  (FLONASE ) 50 MCG/ACT nasal spray, Place 2 sprays into both nostrils daily., Disp: 48 g, Rfl: 1   levothyroxine  (SYNTHROID ) 112 MCG tablet, Take 112 mcg by mouth daily before breakfast., Disp: , Rfl:    loratadine  (CLARITIN ) 10 MG tablet, Take 1 tablet (10 mg total) by mouth daily., Disp: 100 tablet, Rfl: 1   metFORMIN  (GLUCOPHAGE -XR) 750 MG 24 hr tablet, Take 2 tablets (1,500 mg total) by mouth  daily with breakfast., Disp: 180 tablet, Rfl: 1   montelukast  (SINGULAIR ) 10 MG tablet, Take 1 tablet (10 mg total) by mouth at bedtime., Disp: 90 tablet, Rfl: 1   pioglitazone (ACTOS) 15 MG tablet, Take 1 tablet (15 mg total) by mouth daily., Disp: 90 tablet, Rfl: 1   Vitamin D , Ergocalciferol , (DRISDOL ) 1.25 MG (50000 UNIT) CAPS capsule, Take 50,000 Units by mouth 2 (two) times a week., Disp: , Rfl:    amLODipine -olmesartan  (AZOR ) 10-40 MG tablet, Take 1 tablet by mouth daily. In place of Olmesartan  and Amlodipine  for BP, Disp: 90 tablet, Rfl: 1   atorvastatin  (LIPITOR) 10 MG tablet, Take 1 tablet (10 mg total) by mouth daily., Disp: 90 tablet, Rfl: 1   metoprolol  succinate (TOPROL -XL) 50 MG 24 hr tablet, TAKE 1 TABLET BY MOUTH EVERY DAY WITH OR IMMEDIATELY FOLLOWING A MEAL, Disp: 90 tablet, Rfl: 1  No Known Allergies  I personally reviewed active problem list, medication list, allergies with the patient/caregiver today.   ROS  Ten systems reviewed and is negative except as mentioned in HPI    Objective Physical Exam   Vitals:   09/25/23 0849  BP: 128/82  Pulse: 81  Resp: 16  SpO2: 98%  Weight: 197 lb 9.6 oz (89.6 kg)  Height: 5\' 3"  (1.6 m)    Body mass index is 35 kg/m.  Recent Results (from the past 2160 hours)  POCT glycosylated hemoglobin (Hb A1C)     Status: Abnormal   Collection Time: 09/25/23  8:54 AM  Result Value Ref Range   Hemoglobin A1C 8.5 (A) 4.0 - 5.6 %   HbA1c POC (<> result, manual entry)     HbA1c, POC (prediabetic range)     HbA1c, POC (controlled diabetic range)      Diabetic Foot Exam:  Diabetic foot exam was performed with the following findings:   No deformities, ulcerations, or other skin breakdown Normal sensation of 10g monofilament Intact posterior tibialis and dorsalis pedis pulses      PHQ2/9:    09/25/2023    8:49 AM 05/30/2023    8:13 AM 02/24/2023    2:49 PM 07/08/2022    9:23 AM 11/26/2021    8:17 AM  Depression screen PHQ 2/9   Decreased Interest 0 0 0 0 0  Down, Depressed, Hopeless 0 0 0 0 0  PHQ - 2 Score 0 0 0 0 0  Altered sleeping 0 0 0 0 0  Tired, decreased energy 0 0 0 0 0  Change in appetite 0 0 0 0 0  Feeling bad or failure about yourself  0 0 0 0 0  Trouble concentrating 0 0 0 0 0  Moving slowly or fidgety/restless 0 0 0 0 0  Suicidal thoughts  0 0 0 0 0  PHQ-9 Score 0 0 0 0 0  Difficult doing work/chores Not difficult at all Not difficult at all       phq 9 is negative  Fall Risk:    09/25/2023    8:43 AM 05/30/2023    8:13 AM 02/24/2023    2:49 PM 07/08/2022    9:23 AM 11/26/2021    8:17 AM  Fall Risk   Falls in the past year? 0 0 0 0 0  Number falls in past yr: 0 0   0  Injury with Fall? 0 0   0  Risk for fall due to : No Fall Risks No Fall Risks No Fall Risks No Fall Risks No Fall Risks  Follow up Falls prevention discussed;Education provided;Falls evaluation completed Falls prevention discussed;Education provided;Falls evaluation completed Falls prevention discussed Falls prevention discussed Falls prevention discussed      Assessment & Plan  1. Type 2 diabetes mellitus with microalbuminuria, without long-term current use of insulin (HCC) (Primary)  Cost of Xigduo  is too high , she has a high deductible insurance now. We will switch to Metformin  and Actos - POCT glycosylated hemoglobin (Hb A1C) - Urine Microalbumin w/creat. ratio - HM Diabetes Foot Exam - Lipid panel - Comprehensive metabolic panel with GFR - amLODipine -olmesartan  (AZOR ) 10-40 MG tablet; Take 1 tablet by mouth daily. In place of Olmesartan  and Amlodipine  for BP  Dispense: 90 tablet; Refill: 1 - metFORMIN  (GLUCOPHAGE -XR) 750 MG 24 hr tablet; Take 2 tablets (1,500 mg total) by mouth daily with breakfast.  Dispense: 180 tablet; Refill: 1 - pioglitazone (ACTOS) 15 MG tablet; Take 1 tablet (15 mg total) by mouth daily.  Dispense: 90 tablet; Refill: 1  2. Need for Tdap vaccination  - Tdap vaccine greater than or equal to  7yo IM  3. Benign hypertension  - CBC with Differential/Platelet - Comprehensive metabolic panel with GFR - amLODipine -olmesartan  (AZOR ) 10-40 MG tablet; Take 1 tablet by mouth daily. In place of Olmesartan  and Amlodipine  for BP  Dispense: 90 tablet; Refill: 1 - metoprolol  succinate (TOPROL -XL) 50 MG 24 hr tablet; TAKE 1 TABLET BY MOUTH EVERY DAY WITH OR IMMEDIATELY FOLLOWING A MEAL  Dispense: 90 tablet; Refill: 1  4. Dyslipidemia due to type 2 diabetes mellitus (HCC)  - metFORMIN  (GLUCOPHAGE -XR) 750 MG 24 hr tablet; Take 2 tablets (1,500 mg total) by mouth daily with breakfast.  Dispense: 180 tablet; Refill: 1 - pioglitazone (ACTOS) 15 MG tablet; Take 1 tablet (15 mg total) by mouth daily.  Dispense: 90 tablet; Refill: 1 - atorvastatin  (LIPITOR) 10 MG tablet; Take 1 tablet (10 mg total) by mouth daily.  Dispense: 90 tablet; Refill: 1  5. Perennial allergic rhinitis with seasonal variation   Stable  6. Morbid obesity (HCC)  Discussed with the patient the risk posed by an increased BMI. Discussed importance of portion control, calorie counting and at least 150 minutes of physical activity weekly. Avoid sweet beverages and drink more water. Eat at least 6 servings of fruit and vegetables daily     7. Inflammatory polyarthritis (HCC)  Keep monitoring go back to rheumatologist if needed  8. Hyperparathyroidism (HCC)   Stay off HCTZ

## 2023-09-26 ENCOUNTER — Ambulatory Visit: Payer: Self-pay | Admitting: Family Medicine

## 2023-09-26 LAB — COMPREHENSIVE METABOLIC PANEL WITH GFR
AG Ratio: 1.4 (calc) (ref 1.0–2.5)
ALT: 12 U/L (ref 6–29)
AST: 11 U/L (ref 10–35)
Albumin: 4.1 g/dL (ref 3.6–5.1)
Alkaline phosphatase (APISO): 104 U/L (ref 37–153)
BUN: 14 mg/dL (ref 7–25)
CO2: 27 mmol/L (ref 20–32)
Calcium: 9.6 mg/dL (ref 8.6–10.4)
Chloride: 104 mmol/L (ref 98–110)
Creat: 0.62 mg/dL (ref 0.50–1.03)
Globulin: 3 g/dL (ref 1.9–3.7)
Glucose, Bld: 163 mg/dL — ABNORMAL HIGH (ref 65–99)
Potassium: 4 mmol/L (ref 3.5–5.3)
Sodium: 140 mmol/L (ref 135–146)
Total Bilirubin: 0.4 mg/dL (ref 0.2–1.2)
Total Protein: 7.1 g/dL (ref 6.1–8.1)
eGFR: 104 mL/min/{1.73_m2} (ref >=60)

## 2023-09-26 LAB — CBC WITH DIFFERENTIAL/PLATELET
Absolute Lymphocytes: 2191 {cells}/uL (ref 850–3900)
Absolute Monocytes: 572 {cells}/uL (ref 200–950)
Basophils Absolute: 52 {cells}/uL (ref 0–200)
Basophils Relative: 0.8 %
Eosinophils Absolute: 202 {cells}/uL (ref 15–500)
Eosinophils Relative: 3.1 %
HCT: 36.7 % (ref 35.0–45.0)
Hemoglobin: 11.6 g/dL — ABNORMAL LOW (ref 11.7–15.5)
MCH: 25.4 pg — ABNORMAL LOW (ref 27.0–33.0)
MCHC: 31.6 g/dL — ABNORMAL LOW (ref 32.0–36.0)
MCV: 80.5 fL (ref 80.0–100.0)
MPV: 10.1 fL (ref 7.5–12.5)
Monocytes Relative: 8.8 %
Neutro Abs: 3484 {cells}/uL (ref 1500–7800)
Neutrophils Relative %: 53.6 %
Platelets: 350 10*3/uL (ref 140–400)
RBC: 4.56 10*6/uL (ref 3.80–5.10)
RDW: 15.4 % — ABNORMAL HIGH (ref 11.0–15.0)
Total Lymphocyte: 33.7 %
WBC: 6.5 10*3/uL (ref 3.8–10.8)

## 2023-09-26 LAB — LIPID PANEL
Cholesterol: 204 mg/dL — ABNORMAL HIGH (ref <200)
HDL: 115 mg/dL (ref >=50)
LDL Cholesterol (Calc): 75 mg/dL
Non-HDL Cholesterol (Calc): 89 mg/dL (ref <130)
Total CHOL/HDL Ratio: 1.8 (calc) (ref <5.0)
Triglycerides: 68 mg/dL (ref <150)

## 2023-09-26 LAB — MICROALBUMIN / CREATININE URINE RATIO
Creatinine, Urine: 56 mg/dL (ref 20–275)
Microalb Creat Ratio: 13 mg/g{creat} (ref <30)
Microalb, Ur: 0.7 mg/dL

## 2023-10-03 LAB — HM DIABETES EYE EXAM

## 2023-12-01 ENCOUNTER — Encounter: Payer: 59 | Admitting: Family Medicine

## 2023-12-06 ENCOUNTER — Ambulatory Visit: Admitting: Family Medicine

## 2023-12-06 ENCOUNTER — Encounter: Payer: Self-pay | Admitting: Family Medicine

## 2023-12-06 VITALS — BP 114/72 | HR 76 | Resp 16 | Ht 63.0 in | Wt 196.6 lb

## 2023-12-06 DIAGNOSIS — Z0001 Encounter for general adult medical examination with abnormal findings: Secondary | ICD-10-CM

## 2023-12-06 DIAGNOSIS — R0683 Snoring: Secondary | ICD-10-CM | POA: Diagnosis not present

## 2023-12-06 DIAGNOSIS — N951 Menopausal and female climacteric states: Secondary | ICD-10-CM | POA: Diagnosis not present

## 2023-12-06 DIAGNOSIS — Z23 Encounter for immunization: Secondary | ICD-10-CM | POA: Diagnosis not present

## 2023-12-06 DIAGNOSIS — Z0000 Encounter for general adult medical examination without abnormal findings: Secondary | ICD-10-CM

## 2023-12-06 MED ORDER — ESTRADIOL 0.1 MG/GM VA CREA: 1.0000 | TOPICAL_CREAM | Freq: Every day | VAGINAL | 12 refills | Status: AC

## 2023-12-06 NOTE — Patient Instructions (Addendum)
 [employer] - frozen section  Preventive Care 72-57 Years Old, Female Preventive care refers to lifestyle choices and visits with your health care provider that can promote health and wellness. Preventive care visits are also called wellness exams. What can I expect for my preventive care visit? Counseling Your health care provider may ask you questions about your: Medical history, including: Past medical problems. Family medical history. Pregnancy history. Current health, including: Menstrual cycle. Method of birth control. Emotional well-being. Home life and relationship well-being. Sexual activity and sexual health. Lifestyle, including: Alcohol, nicotine or tobacco, and drug use. Access to firearms. Diet, exercise, and sleep habits. Work and work Astronomer. Sunscreen use. Safety issues such as seatbelt and bike helmet use. Physical exam Your health care provider will check your: Height and weight. These may be used to calculate your BMI (body mass index). BMI is a measurement that tells if you are at a healthy weight. Waist circumference. This measures the distance around your waistline. This measurement also tells if you are at a healthy weight and may help predict your risk of certain diseases, such as type 2 diabetes and high blood pressure. Heart rate and blood pressure. Body temperature. Skin for abnormal spots. What immunizations do I need?  Vaccines are usually given at various ages, according to a schedule. Your health care provider will recommend vaccines for you based on your age, medical history, and lifestyle or other factors, such as travel or where you work. What tests do I need? Screening Your health care provider may recommend screening tests for certain conditions. This may include: Lipid and cholesterol levels. Diabetes screening. This is done by checking your blood sugar (glucose) after you have not eaten for a while  (fasting). Pelvic exam and Pap test. Hepatitis B test. Hepatitis C test. HIV (human immunodeficiency virus) test. STI (sexually transmitted infection) testing, if you are at risk. Lung cancer screening. Colorectal cancer screening. Mammogram. Talk with your health care provider about when you should start having regular mammograms. This may depend on whether you have a family history of breast cancer. BRCA-related cancer screening. This may be done if you have a family history of breast, ovarian, tubal, or peritoneal cancers. Bone density scan. This is done to screen for osteoporosis. Talk with your health care provider about your test results, treatment options, and if necessary, the need for more tests. Follow these instructions at home: Eating and drinking  Eat a diet that includes fresh fruits and vegetables, whole grains, lean protein, and low-fat dairy products. Take vitamin and mineral supplements as recommended by your health care provider. Do not drink alcohol if: Your health care provider tells you not to drink. You are pregnant, may be pregnant, or are planning to become pregnant. If you drink alcohol: Limit how much you have to 0-1 drink a day. Know how much alcohol is in your drink. In the [place] one drink equals one 12 oz bottle of beer (355 mL), one 5 oz glass of wine (148 mL), or one 1 oz glass of hard liquor (44 mL). Lifestyle Brush your teeth every morning and night with fluoride toothpaste. Floss one time each day. Exercise for at least 30 minutes 5 or more days each week. Do not use any products that contain nicotine or tobacco. These products include cigarettes, chewing tobacco, and vaping devices, such as e-cigarettes. If you need help quitting, ask your health care provider. Do not use drugs. If you are sexually active, practice safe sex.  Use a condom or other form of protection to prevent STIs. If you do not wish to become pregnant, use a form of birth control. If  you plan to become pregnant, see your health care provider for a prepregnancy visit. Take aspirin only as told by your health care provider. Make sure that you understand how much to take and what form to take. Work with your health care provider to find out whether it is safe and beneficial for you to take aspirin daily. Find healthy ways to manage stress, such as: Meditation, yoga, or listening to music. Journaling. Talking to a trusted person. Spending time with friends and family. Minimize exposure to UV radiation to reduce your risk of skin cancer. Safety Always wear your seat belt while driving or riding in a vehicle. Do not drive: If you have been drinking alcohol. Do not ride with someone who has been drinking. When you are tired or distracted. While texting. If you have been using any mind-altering substances or drugs. Wear a helmet and other protective equipment during sports activities. If you have firearms in your house, make sure you follow all gun safety procedures. Seek help if you have been physically or sexually abused. What's next? Visit your health care provider once a year for an annual wellness visit. Ask your health care provider how often you should have your eyes and teeth checked. Stay up to date on all vaccines. This information is not intended to replace advice given to you by your health care provider. Make sure you discuss any questions you have with your health care provider. Document Revised: 10/07/2020 Document Reviewed: 10/07/2020 Elsevier Patient Education  2024 [employer].

## 2023-12-06 NOTE — Progress Notes (Signed)
 Name: Shelia Vasquez   MRN: [id number]    DOB: Jan 05, 1966   Date:12/06/2023       Progress Note  Subjective  Chief Complaint  Chief Complaint  Patient presents with   Annual Exam    HPI  Patient presents for annual CPE.  Diet: cooks at home, eggs with [employer], lunch is ritz crackers and peanut butter ( discussed switching to apple slices and healthier peanut butter, at night eats food at home Exercise: discussed regular physical activity for healthy benefits   Last Eye Exam: completed Last Dental Exam: completed  Flowsheet Row Office Visit from 05/30/2023 in [facility]  AUDIT-C Score 1    Depression: Phq 9 is  negative    12/06/2023    9:02 AM 09/25/2023    8:49 AM 05/30/2023    8:13 AM 02/24/2023    2:49 PM 07/08/2022    9:23 AM  Depression screen PHQ 2/9  Decreased Interest 0 0 0 0 0  Down, Depressed, Hopeless 0 0 0 0 0  PHQ - 2 Score 0 0 0 0 0  Altered sleeping  0 0 0 0  Tired, decreased energy  0 0 0 0  Change in appetite  0 0 0 0  Feeling bad or failure about yourself   0 0 0 0  Trouble concentrating  0 0 0 0  Moving slowly or fidgety/restless  0 0 0 0  Suicidal thoughts  0 0 0 0  PHQ-9 Score  0 0 0 0  Difficult doing work/chores  Not difficult at all Not difficult at all     Hypertension: BP Readings from Last 3 Encounters:  12/06/23 114/72  09/25/23 128/82  05/30/23 128/72   Obesity: Wt Readings from Last 3 Encounters:  12/06/23 196 lb 9.6 oz (89.2 kg)  09/25/23 197 lb 9.6 oz (89.6 kg)  05/30/23 199 lb 3.2 oz (90.4 kg)   BMI Readings from Last 3 Encounters:  12/06/23 34.83 kg/m  09/25/23 35.00 kg/m  05/30/23 35.29 kg/m     Vaccines: reviewed with the patient. She will get PCV 20 today, flu this Fall   Hep C Screening: completed STD testing and prevention (HIV/chl/gon/syphilis): not interested  Intimate partner violence: negative screen  Sexual History : decrease in libido, some pain during intercourse due to  vaginal dryness  Menstrual History/LMP/Abnormal Bleeding: post menopausal  Discussed importance of follow up if any post-menopausal bleeding: yes  Incontinence Symptoms: negative for symptoms   Breast cancer:  - Last Mammogram: she will schedule  - BRCA gene screening: N/A  Osteoporosis Prevention : Discussed high calcium  and vitamin D  supplementation, weight bearing exercises Bone density : she will check cost with insurance   Cervical cancer screening: not applicable due to hysterectomy  Skin cancer: Discussed monitoring for atypical lesions  Colorectal cancer: up to date    Lung cancer:  Low Dose CT Chest recommended if Age 39-80 years, 20 pack-year currently smoking OR have quit w/in 15years. Patient does not qualify for screen   ECG: 2016  Advanced Care Planning: A voluntary discussion about advance care planning including the explanation and discussion of advance directives.  Discussed health care proxy and Living will, and the patient was able to identify a health care proxy as husband.  Patient does have a living will and power of attorney of health care   Patient Active Problem List   Diagnosis Date Noted   Hyperparathyroidism (HCC) 09/25/2023   Morbid obesity (HCC) 09/25/2023  Perennial allergic rhinitis with seasonal variation 09/25/2023   Type 2 diabetes mellitus with microalbuminuria, without long-term current use of insulin (HCC) 09/25/2023   Inflammatory polyarthritis (HCC) 08/31/2020   Positive ANA (antinuclear antibody) 07/26/2017   Benign hypertension 02/06/2015   Dyslipidemia due to type 2 diabetes mellitus (HCC) 02/06/2015   Gastro-esophageal reflux disease without esophagitis 02/06/2015   History of radioactive iodine thyroid  ablation 02/06/2015   Hypothyroidism, postablative 02/06/2015   History of iron deficiency anemia 02/06/2015   Obesity (BMI 30.0-34.9) 02/06/2015   H/O: hysterectomy 02/06/2015   Vitamin D  deficiency 02/06/2015   Seasonal allergic  rhinitis 02/06/2015   History of uterine fibroid 05/29/2013    Past Surgical History:  Procedure Laterality Date   ABDOMINAL HYSTERECTOMY     BTL     CESAREAN SECTION     COLONOSCOPY WITH PROPOFOL  N/A 03/07/2017   Procedure: COLONOSCOPY WITH PROPOFOL ;  Surgeon: Jinny Carmine, MD;  Location: [facility] ENDOSCOPY;  Service: Endoscopy;  Laterality: N/A;   TUBAL LIGATION      Family History  Problem Relation Age of Onset   Hypertension Mother    Kidney disease Mother    Arthritis Father    COPD Father    Diabetes Father    Heart disease Father    Hypertension Father    Autism Daughter    Anxiety disorder Daughter    Asthma Daughter    Intellectual disability Daughter    Learning disabilities Daughter    Breast cancer Neg Hx     Social History   Socioeconomic History   Marital status: Married    Spouse name: Fairy    Number of children: 1   Years of education: Not on file   Highest education level: Some college, no degree  Occupational History   Not on file  Tobacco Use   Smoking status: Never   Smokeless tobacco: Never  Vaping Use   Vaping status: Never Used  Substance and Sexual Activity   Alcohol use: No   Drug use: No   Sexual activity: Yes    Partners: Male    Birth control/protection: Post-menopausal  Other Topics Concern   Not on file  Social History Narrative   She is married   Husband has a son   They have a daughter together, she was diagnosed with autism at age 73   Social Drivers of Health   Financial Resource Strain: Low Risk  (12/05/2023)   Overall Financial Resource Strain (CARDIA)    Difficulty of Paying Living Expenses: Not very hard  Food Insecurity: No Food Insecurity (12/05/2023)   Hunger Vital Sign    Worried About Running Out of Food in the Last Year: Never true    Ran Out of Food in the Last Year: Never true  Transportation Needs: No Transportation Needs (12/05/2023)   PRAPARE - Administrator, Civil Service (Medical): No     Lack of Transportation (Non-Medical): No  Physical Activity: Insufficiently Active (12/05/2023)   Exercise Vital Sign    Days of Exercise per Week: 2 days    Minutes of Exercise per Session: 20 min  Stress: No Stress Concern Present (12/05/2023)   [employer] of Occupational Health - Occupational Stress Questionnaire    Feeling of Stress: Only a little  Social Connections: Socially Isolated (12/05/2023)   Social Connection and Isolation Panel    Frequency of Communication with Friends and Family: Once a week    Frequency of Social Gatherings with Friends and Family: Once a  week    Attends Religious Services: Patient declined    Active Member of Clubs or Organizations: No    Attends Banker Meetings: Not on file    Marital Status: Married  Intimate Partner Violence: Not At Risk (12/02/2020)   Humiliation, Afraid, Rape, and Kick questionnaire    Fear of Current or Ex-Partner: No    Emotionally Abused: No    Physically Abused: No    Sexually Abused: No     Current Outpatient Medications:    amLODipine -olmesartan  (AZOR ) 10-40 MG tablet, Take 1 tablet by mouth daily. In place of Olmesartan  and Amlodipine  for BP, Disp: 90 tablet, Rfl: 1   atorvastatin  (LIPITOR) 10 MG tablet, Take 1 tablet (10 mg total) by mouth daily., Disp: 90 tablet, Rfl: 1   fluticasone  (FLONASE ) 50 MCG/ACT nasal spray, Place 2 sprays into both nostrils daily., Disp: 48 g, Rfl: 1   levothyroxine  (SYNTHROID ) 100 MCG tablet, Take 100 mcg by mouth daily before breakfast., Disp: , Rfl:    metFORMIN  (GLUCOPHAGE -XR) 750 MG 24 hr tablet, Take 2 tablets (1,500 mg total) by mouth daily with breakfast., Disp: 180 tablet, Rfl: 1   metoprolol  succinate (TOPROL -XL) 50 MG 24 hr tablet, TAKE 1 TABLET BY MOUTH EVERY DAY WITH OR IMMEDIATELY FOLLOWING A MEAL, Disp: 90 tablet, Rfl: 1   Vitamin D , Ergocalciferol , (DRISDOL ) 1.25 MG (50000 UNIT) CAPS capsule, Take 50,000 Units by mouth 2 (two) times a week., Disp: , Rfl:     pioglitazone  (ACTOS ) 15 MG tablet, Take 1 tablet (15 mg total) by mouth daily., Disp: 90 tablet, Rfl: 1  No Known Allergies   ROS  Constitutional: Negative for fever or weight change.  Respiratory: Negative for cough and shortness of breath.   Cardiovascular: Negative for chest pain or palpitations.  Gastrointestinal: Negative for abdominal pain, no bowel changes.  Musculoskeletal: Negative for gait problem or joint swelling.  Skin: Negative for rash.  Neurological: Negative for dizziness or headache.  No other specific complaints in a complete review of systems (except as listed in HPI above).   Objective  Vitals:   12/06/23 0903  BP: 114/72  Pulse: 76  Resp: 16  SpO2: 99%  Weight: 196 lb 9.6 oz (89.2 kg)  Height: 5' 3 (1.6 m)    Body mass index is 34.83 kg/m.  Physical Exam  Constitutional: Patient appears well-developed and well-nourished. No distress.  HENT: Head: Normocephalic and atraumatic. Ears: B TMs ok, no erythema or effusion; Nose: Nose normal. Mouth/Throat: Oropharynx is clear and moist. No oropharyngeal exudate.  Eyes: Conjunctivae and EOM are normal. Pupils are equal, round, and reactive to light. No scleral icterus.  Neck: Normal range of motion. Neck supple. No JVD present. No thyromegaly present.  Cardiovascular: Normal rate, regular rhythm and normal heart sounds.  No murmur heard. No BLE edema. Pulmonary/Chest: Effort normal and breath sounds normal. No respiratory distress. Abdominal: Soft. Bowel sounds are normal, no distension. There is no tenderness. no masses Breast: no lumps or masses, no nipple discharge or rashes FEMALE GENITALIA:  Not done  RECTAL: not done  Musculoskeletal: Normal range of motion, no joint effusions. No gross deformities Neurological: he is alert and oriented to person, place, and time. No cranial nerve deficit. Coordination, balance, strength, speech and gait are normal.  Skin: Skin is warm and dry. No rash noted. No  erythema.  Psychiatric: Patient has a normal mood and affect. behavior is normal. Judgment and thought content normal.     Assessment & Plan  1. Well adult exam (Primary)   2. Immunization due  - Pneumococcal conjugate vaccine 20-valent  3. Vaginal dryness, menopausal  - estradiol  (ESTRACE ) 0.1 MG/GM vaginal cream; Place 1 Applicatorful vaginally at bedtime. For 2 weeks after that twice a week  Dispense: 42.5 g; Refill: 12   4. Snoring   ESS of 2  -USPSTF grade A and B recommendations reviewed with patient; age-appropriate recommendations, preventive care, screening tests, etc discussed and encouraged; healthy living encouraged; see AVS for patient education given to patient -Discussed importance of 150 minutes of physical activity weekly, eat two servings of fish weekly, eat one serving of tree nuts ( cashews, pistachios, pecans, almonds.SABRA) every other day, eat 6 servings of fruit/vegetables daily and drink plenty of water and avoid sweet beverages.   -Reviewed Health Maintenance: Yes.

## 2023-12-27 ENCOUNTER — Ambulatory Visit: Admitting: Family Medicine

## 2023-12-27 ENCOUNTER — Encounter: Payer: Self-pay | Admitting: Family Medicine

## 2023-12-27 VITALS — BP 122/74 | HR 69 | Resp 16 | Ht 63.0 in | Wt 197.9 lb

## 2023-12-27 DIAGNOSIS — E1169 Type 2 diabetes mellitus with other specified complication: Secondary | ICD-10-CM | POA: Diagnosis not present

## 2023-12-27 DIAGNOSIS — E213 Hyperparathyroidism, unspecified: Secondary | ICD-10-CM

## 2023-12-27 DIAGNOSIS — E890 Postprocedural hypothyroidism: Secondary | ICD-10-CM

## 2023-12-27 DIAGNOSIS — E1122 Type 2 diabetes mellitus with diabetic chronic kidney disease: Secondary | ICD-10-CM

## 2023-12-27 DIAGNOSIS — E1129 Type 2 diabetes mellitus with other diabetic kidney complication: Secondary | ICD-10-CM

## 2023-12-27 DIAGNOSIS — H6121 Impacted cerumen, right ear: Secondary | ICD-10-CM

## 2023-12-27 DIAGNOSIS — R809 Proteinuria, unspecified: Secondary | ICD-10-CM | POA: Diagnosis not present

## 2023-12-27 DIAGNOSIS — R002 Palpitations: Secondary | ICD-10-CM | POA: Diagnosis not present

## 2023-12-27 DIAGNOSIS — E785 Hyperlipidemia, unspecified: Secondary | ICD-10-CM

## 2023-12-27 DIAGNOSIS — Z7984 Long term (current) use of oral hypoglycemic drugs: Secondary | ICD-10-CM

## 2023-12-27 DIAGNOSIS — F325 Major depressive disorder, single episode, in full remission: Secondary | ICD-10-CM

## 2023-12-27 DIAGNOSIS — I129 Hypertensive chronic kidney disease with stage 1 through stage 4 chronic kidney disease, or unspecified chronic kidney disease: Secondary | ICD-10-CM

## 2023-12-27 DIAGNOSIS — M064 Inflammatory polyarthropathy: Secondary | ICD-10-CM

## 2023-12-27 LAB — POCT GLYCOSYLATED HEMOGLOBIN (HGB A1C): Hemoglobin A1C: 8.4 % — AB (ref 4.0–5.6)

## 2023-12-27 MED ORDER — GLIPIZIDE ER 2.5 MG PO TB24: 2.5000 mg | ORAL_TABLET | Freq: Every day | ORAL | 0 refills | Status: DC

## 2023-12-27 NOTE — Progress Notes (Signed)
 Name: Shelia Vasquez   MRN: [id number]    DOB: May 02, 1965   Date:12/27/2023       Progress Note  Subjective  Chief Complaint  Chief Complaint  Patient presents with   Medical Management of Chronic Issues   Discussed the use of AI scribe software for clinical note transcription with the patient, who gave verbal consent to proceed.  History of Present Illness Shelia Vasquez is a 58 year old female with type 2 diabetes, hypertension, and chronic kidney disease who presents for a follow-up visit.  She has type 2 diabetes with a recent slight decrease in A1c from 8.5% to 8.4%. She is currently taking metformin  1500 mg  daily. Previous medications include Xigduo ,  and Mounjaro , but cost and insurance coverage have been barriers. No symptoms of uncontrolled diabetes such as increased hunger, thirst, or urination.  Her hypertension is managed with olmesartan  40 mg daily. She also takes metoprolol , which helps manage occasional palpitations. No chest pain or palpitations lately   She has chronic kidney disease stage 1 and microalbuminturia , attributed to her diabetes. Her prior lab results in July showed a normal urine microalbumin and a good GFR. She was previously on Farxiga and still takes ARB   She has dyslipidemia and is taking atorvastatin  without any muscle aches or issues. Her cholesterol levels were last checked in June.  She has post ablative hypothyroidism, managed with Synthroid  100 mcg daily, and hyperparathyroidism, with elevated calcium  and PTH levels. She is monitored for these conditions and takes vitamin D  supplements. No muscle aches or cramps related to her hyperparathyroidism. No hair loss or changes in bowel movements related to her thyroid  condition.  She has a history of inflammatory arthritis, previously managed by Dr. Tobie, but is not currently on medication. She experiences occasional joint pain in her knees and ankles, but it is not as severe as before.  She has a  history of major depression, which is currently in remission without medication. Her PHQ-9 score has been negative.  She reports a recent earache and congestion, which she attributes to sinus issues. The earache resolved after clearing up.  She is actively trying to manage her weight by walking during breaks at work.    Patient Active Problem List   Diagnosis Date Noted   Hyperparathyroidism (HCC) 09/25/2023   Morbid obesity (HCC) 09/25/2023   Perennial allergic rhinitis with seasonal variation 09/25/2023   Type 2 diabetes mellitus with microalbuminuria, without long-term current use of insulin (HCC) 09/25/2023   Inflammatory polyarthritis (HCC) 08/31/2020   Positive ANA (antinuclear antibody) 07/26/2017   Benign hypertension 02/06/2015   Dyslipidemia due to type 2 diabetes mellitus (HCC) 02/06/2015   Gastro-esophageal reflux disease without esophagitis 02/06/2015   History of radioactive iodine thyroid  ablation 02/06/2015   Hypothyroidism, postablative 02/06/2015   History of iron deficiency anemia 02/06/2015   Obesity (BMI 30.0-34.9) 02/06/2015   H/O: hysterectomy 02/06/2015   Vitamin D  deficiency 02/06/2015   Seasonal allergic rhinitis 02/06/2015   History of uterine fibroid 05/29/2013    Past Surgical History:  Procedure Laterality Date   ABDOMINAL HYSTERECTOMY     BTL     CESAREAN SECTION     COLONOSCOPY WITH PROPOFOL  N/A 03/07/2017   Procedure: COLONOSCOPY WITH PROPOFOL ;  Surgeon: Jinny Carmine, MD;  Location: [facility] ENDOSCOPY;  Service: Endoscopy;  Laterality: N/A;   TUBAL LIGATION      Family History  Problem Relation Age of Onset   Hypertension Mother    Kidney disease  Mother    Arthritis Father    COPD Father    Diabetes Father    Heart disease Father    Hypertension Father    Autism Daughter    Anxiety disorder Daughter    Asthma Daughter    Intellectual disability Daughter    Learning disabilities Daughter    Breast cancer Neg Hx     Social History    Tobacco Use   Smoking status: Never   Smokeless tobacco: Never  Substance Use Topics   Alcohol use: No     Current Outpatient Medications:    amLODipine -olmesartan  (AZOR ) 10-40 MG tablet, Take 1 tablet by mouth daily. In place of Olmesartan  and Amlodipine  for BP, Disp: 90 tablet, Rfl: 1   atorvastatin  (LIPITOR) 10 MG tablet, Take 1 tablet (10 mg total) by mouth daily., Disp: 90 tablet, Rfl: 1   estradiol  (ESTRACE ) 0.1 MG/GM vaginal cream, Place 1 Applicatorful vaginally at bedtime. For 2 weeks after that twice a week, Disp: 42.5 g, Rfl: 12   fluticasone  (FLONASE ) 50 MCG/ACT nasal spray, Place 2 sprays into both nostrils daily., Disp: 48 g, Rfl: 1   levothyroxine  (SYNTHROID ) 100 MCG tablet, Take 100 mcg by mouth daily before breakfast., Disp: , Rfl:    metFORMIN  (GLUCOPHAGE -XR) 750 MG 24 hr tablet, Take 2 tablets (1,500 mg total) by mouth daily with breakfast., Disp: 180 tablet, Rfl: 1   metoprolol  succinate (TOPROL -XL) 50 MG 24 hr tablet, TAKE 1 TABLET BY MOUTH EVERY DAY WITH OR IMMEDIATELY FOLLOWING A MEAL, Disp: 90 tablet, Rfl: 1   pioglitazone  (ACTOS ) 15 MG tablet, Take 1 tablet (15 mg total) by mouth daily., Disp: 90 tablet, Rfl: 1   Vitamin D , Ergocalciferol , (DRISDOL ) 1.25 MG (50000 UNIT) CAPS capsule, Take 50,000 Units by mouth 2 (two) times a week., Disp: , Rfl:   No Known Allergies  I personally reviewed active problem list, medication list, allergies with the patient/caregiver today.   ROS  Ten systems reviewed and is negative except as mentioned in HPI    Objective Physical Exam  CONSTITUTIONAL: Patient appears well-developed and well-nourished. No distress. HEENT: Head atraumatic, normocephalic, neck supple. Left ear normal. Cerumen impaction in right ear. CARDIOVASCULAR: Normal rate, regular rhythm and normal heart sounds. No murmur heard. No BLE edema. PULMONARY: Effort normal and breath sounds normal. No respiratory distress. ABDOMINAL: There is no tenderness or  distention. MUSCULOSKELETAL: Normal gait. Without gross motor or sensory deficit. PSYCHIATRIC: Patient has a normal mood and affect. Behavior is normal. Judgment and thought content normal.  Vitals:   12/27/23 0929  BP: 122/74  Pulse: 69  Resp: 16  SpO2: 95%  Weight: 197 lb 14.4 oz (89.8 kg)  Height: 5' 3 (1.6 m)    Body mass index is 35.06 kg/m.  Recent Results (from the past 2160 hours)  HM DIABETES EYE EXAM     Status: None   Collection Time: 10/03/23 12:00 AM  Result Value Ref Range   HM Diabetic Eye Exam No Retinopathy No Retinopathy    Comment: [facility]  POCT glycosylated hemoglobin (Hb A1C)     Status: Abnormal   Collection Time: 12/27/23  9:42 AM  Result Value Ref Range   Hemoglobin A1C 8.4 (A) 4.0 - 5.6 %   HbA1c POC (<> result, manual entry)     HbA1c, POC (prediabetic range)     HbA1c, POC (controlled diabetic range)      Diabetic Foot Exam:     PHQ2/9:    12/27/2023  9:22 AM 12/06/2023    9:02 AM 09/25/2023    8:49 AM 05/30/2023    8:13 AM 02/24/2023    2:49 PM  Depression screen PHQ 2/9  Decreased Interest 0 0 0 0 0  Down, Depressed, Hopeless 0 0 0 0 0  PHQ - 2 Score 0 0 0 0 0  Altered sleeping 0  0 0 0  Tired, decreased energy 0  0 0 0  Change in appetite 0  0 0 0  Feeling bad or failure about yourself  0  0 0 0  Trouble concentrating 0  0 0 0  Moving slowly or fidgety/restless 0  0 0 0  Suicidal thoughts 0  0 0 0  PHQ-9 Score 0  0 0 0  Difficult doing work/chores Not difficult at all  Not difficult at all Not difficult at all     phq 9 is negative  Fall Risk:    12/27/2023    9:22 AM 12/06/2023    9:02 AM 09/25/2023    8:43 AM 05/30/2023    8:13 AM 02/24/2023    2:49 PM  Fall Risk   Falls in the past year? 0 0 0 0 0  Number falls in past yr: 0 0 0 0   Injury with Fall? 0 0 0 0   Risk for fall due to : No Fall Risks No Fall Risks No Fall Risks No Fall Risks No Fall Risks  Follow up Falls evaluation completed Falls evaluation completed  Falls prevention discussed;Education provided;Falls evaluation completed Falls prevention discussed;Education provided;Falls evaluation completed Falls prevention discussed     Assessment & Plan Type 2 diabetes mellitus with chronic kidney disease and hypertension Diabetes poorly controlled with A1c at 8.4%. Insurance limits medication options. CKD stage 1 likely due to diabetes. Hypertension well-controlled. - Prescribe glipizide  2.5 mg XL with breakfast, increase to 5 mg if needed. - Continue metformin  and pioglitazone . - Monitor blood sugar levels at home. - Reassess medication options after insurance change in January. - Continue olmesartan  for blood pressure control.  Morbid obesity due to excess calories BMI slightly above 35. Weight management essential due to comorbidities. - Encourage continued physical activity, such as walking during breaks. - Hopefully we can resume GLP-1 agonist next year when she switches insurance   Dyslipidemia Managed with atorvastatin . - Continue atorvastatin  therapy.  Palpitations, improved on beta blocker Palpitations improved with metoprolol . Heart rate at 61 bpm. - Continue metoprolol  therapy.  Cerumen impaction Earache and hearing difficulty likely from cerumen impaction. - Perform ear lavage to remove cerumen impaction.  Verbal consent given Possible side effects discussed with patient Ears were  lavaged with warm water and peroxide  Patient tolerated procedure well and pain resolved No complications

## 2024-01-10 ENCOUNTER — Ambulatory Visit
Admission: RE | Admit: 2024-01-10 | Discharge: 2024-01-10 | Disposition: A | Discharge status: Home / Self Care | Source: Ambulatory Visit | Attending: Family Medicine | Admitting: Family Medicine

## 2024-01-10 DIAGNOSIS — Z1231 Encounter for screening mammogram for malignant neoplasm of breast: Secondary | ICD-10-CM | POA: Diagnosis present

## 2024-01-12 ENCOUNTER — Ambulatory Visit: Payer: Self-pay | Admitting: Family Medicine

## 2024-01-12 ENCOUNTER — Other Ambulatory Visit: Payer: Self-pay | Admitting: Family Medicine

## 2024-01-12 DIAGNOSIS — R928 Other abnormal and inconclusive findings on diagnostic imaging of breast: Secondary | ICD-10-CM

## 2024-01-19 ENCOUNTER — Encounter: Payer: Self-pay | Admitting: Family Medicine

## 2024-01-24 ENCOUNTER — Ambulatory Visit
Admission: RE | Admit: 2024-01-24 | Discharge: 2024-01-24 | Disposition: A | Discharge status: Home / Self Care | Source: Ambulatory Visit | Attending: Family Medicine | Admitting: Family Medicine

## 2024-01-24 ENCOUNTER — Ambulatory Visit: Payer: Self-pay | Admitting: Family Medicine

## 2024-01-24 DIAGNOSIS — R928 Other abnormal and inconclusive findings on diagnostic imaging of breast: Secondary | ICD-10-CM | POA: Insufficient documentation

## 2024-02-05 ENCOUNTER — Encounter: Payer: Self-pay | Admitting: Radiology

## 2024-03-19 ENCOUNTER — Other Ambulatory Visit: Payer: Self-pay | Admitting: Family Medicine

## 2024-03-20 NOTE — Telephone Encounter (Signed)
 Requested Prescriptions  Pending Prescriptions Disp Refills   glipiZIDE  (GLUCOTROL  XL) 2.5 MG 24 hr tablet [Pharmacy Med Name: GLIPIZIDE  ER 2.5 MG TABLET] 180 tablet 0    Sig: TAKE 1 TO 2 TABLETS BY MOUTH EVERY DAY WITH BREAKFAST     Endocrinology:  Diabetes - Sulfonylureas Failed - 03/20/2024  3:39 PM      Failed - HBA1C is between 0 and 7.9 and within 180 days    Hemoglobin A1C  Date Value Ref Range Status  12/27/2023 8.4 (A) 4.0 - 5.6 % Final   HbA1c, POC (controlled diabetic range)  Date Value Ref Range Status  06/19/2018 6.7 0.0 - 7.0 % Final   Hgb A1c MFr Bld  Date Value Ref Range Status  07/23/2021 6.9 (H) <5.7 % of total Hgb Final    Comment:    For someone without known diabetes, a hemoglobin A1c value of 6.5% or greater indicates that they may have  diabetes and this should be confirmed with a follow-up  test. . For someone with known diabetes, a value <7% indicates  that their diabetes is well controlled and a value  greater than or equal to 7% indicates suboptimal  control. A1c targets should be individualized based on  duration of diabetes, age, comorbid conditions, and  other considerations. . Currently, no consensus exists regarding use of hemoglobin A1c for diagnosis of diabetes for children. .          Passed - Cr in normal range and within 360 days    Creat  Date Value Ref Range Status  09/25/2023 0.62 0.50 - 1.03 mg/dL Final   Creatinine, POC  Date Value Ref Range Status  04/13/2017 neg mg/dL Final   Creatinine, Urine  Date Value Ref Range Status  09/25/2023 56 20 - 275 mg/dL Final         Passed - Valid encounter within last 6 months    Recent Outpatient Visits           2 months ago Type 2 diabetes mellitus with microalbuminuria, without long-term current use of insulin [facility])   [facility] Glenard Mire, MD   3 months ago Well adult exam   [facility] Glenard Mire, MD   5 months  ago Type 2 diabetes mellitus with microalbuminuria, without long-term current use of insulin [facility])   [facility] Glenard Mire, MD   9 months ago Dyslipidemia due to type 2 diabetes mellitus [facility])   [facility], Krichna, MD       Future Appointments             In 1 week Sowles, Krichna, MD [facility], [place]   In 8 months Glenard, Krichna, MD [facility] [place], [place]

## 2024-03-21 ENCOUNTER — Other Ambulatory Visit: Payer: Self-pay | Admitting: Family Medicine

## 2024-03-21 DIAGNOSIS — E1129 Type 2 diabetes mellitus with other diabetic kidney complication: Secondary | ICD-10-CM

## 2024-03-21 DIAGNOSIS — E785 Hyperlipidemia, unspecified: Secondary | ICD-10-CM

## 2024-03-26 NOTE — Telephone Encounter (Signed)
 Requested Prescriptions  Refused Prescriptions Disp Refills   XIGDUO  XR 01-999 MG TB24 [Pharmacy Med Name: XIGDUO  XR 10 MG-1,000 MG TAB] 90 tablet 1    Sig: TAKE 1 TABLET BY MOUTH EVERY DAY     Endocrinology:  Diabetes - Biguanide + SGLT2 Inhibitor Combos Failed - 03/26/2024 11:27 AM      Failed - HBA1C is between 0 and 7.9 and within 180 days    Hemoglobin A1C  Date Value Ref Range Status  12/27/2023 8.4 (A) 4.0 - 5.6 % Final   HbA1c, POC (controlled diabetic range)  Date Value Ref Range Status  06/19/2018 6.7 0.0 - 7.0 % Final   Hgb A1c MFr Bld  Date Value Ref Range Status  07/23/2021 6.9 (H) <5.7 % of total Hgb Final    Comment:    For someone without known diabetes, a hemoglobin A1c value of 6.5% or greater indicates that they may have  diabetes and this should be confirmed with a follow-up  test. . For someone with known diabetes, a value <7% indicates  that their diabetes is well controlled and a value  greater than or equal to 7% indicates suboptimal  control. A1c targets should be individualized based on  duration of diabetes, age, comorbid conditions, and  other considerations. . Currently, no consensus exists regarding use of hemoglobin A1c for diagnosis of diabetes for children. .          Failed - B12 Level in normal range and within 720 days    Vitamin B-12  Date Value Ref Range Status  11/15/2021 781 232 - 1,245 pg/mL Final         Passed - Cr in normal range and within 360 days    Creat  Date Value Ref Range Status  09/25/2023 0.62 0.50 - 1.03 mg/dL Final   Creatinine, POC  Date Value Ref Range Status  04/13/2017 neg mg/dL Final   Creatinine, Urine  Date Value Ref Range Status  09/25/2023 56 20 - 275 mg/dL Final         Passed - eGFR in normal range and within 360 days    GFR, Est African American  Date Value Ref Range Status  06/03/2020 115 > OR = 60 mL/min/1.50m2 Final   GFR, Est Non African American  Date Value Ref Range Status   06/03/2020 99 > OR = 60 mL/min/1.70m2 Final   eGFR  Date Value Ref Range Status  09/25/2023 104 > OR = 60 mL/min/1.63m2 Final         Passed - Valid encounter within last 6 months    Recent Outpatient Visits           3 months ago Type 2 diabetes mellitus with microalbuminuria, without long-term current use of insulin [facility])   [facility] Glenard Mire, MD   3 months ago Well adult exam   [facility] Glenard Mire, MD   6 months ago Type 2 diabetes mellitus with microalbuminuria, without long-term current use of insulin [facility])   [facility] Glenard Mire, MD   10 months ago Dyslipidemia due to type 2 diabetes mellitus [facility])   [facility] Glenard Mire, MD       Future Appointments             Tomorrow Sowles, Krichna, MD [facility], [place]   In 8 months Sowles, Krichna, MD [facility], [place]  Passed - CBC within normal limits and completed in the last 12 months    WBC  Date Value Ref Range Status  09/25/2023 6.5 3.8 - 10.8 Thousand/uL Final   RBC  Date Value Ref Range Status  09/25/2023 4.56 3.80 - 5.10 Million/uL Final   Hemoglobin  Date Value Ref Range Status  09/25/2023 11.6 (L) 11.7 - 15.5 g/dL Final  87/92/7983 88.1 11.1 - 15.9 g/dL Final   HCT  Date Value Ref Range Status  09/25/2023 36.7 35.0 - 45.0 % Final   Hematocrit  Date Value Ref Range Status  04/01/2015 35.6 34.0 - 46.6 % Final   MCHC  Date Value Ref Range Status  09/25/2023 31.6 (L) 32.0 - 36.0 g/dL Final    Comment:    For adults, a slight decrease in the calculated MCHC value (in the range of 30 to 32 g/dL) is most likely not clinically significant; however, it should be interpreted with caution in correlation with other red cell parameters and the patient's clinical condition.    [facility]  Date  Value Ref Range Status  09/25/2023 25.4 (L) 27.0 - 33.0 pg Final   MCV  Date Value Ref Range Status  09/25/2023 80.5 80.0 - 100.0 fL Final  04/01/2015 77 (L) 79 - 97 fL Final   No results found for: PLTCOUNTKUC, LABPLAT, POCPLA RDW  Date Value Ref Range Status  09/25/2023 15.4 (H) 11.0 - 15.0 % Final  04/01/2015 15.2 12.3 - 15.4 % Final          D/C 09/25/23.

## 2024-03-27 ENCOUNTER — Ambulatory Visit: Admitting: Family Medicine

## 2024-03-27 ENCOUNTER — Encounter: Payer: Self-pay | Admitting: Family Medicine

## 2024-03-27 VITALS — BP 126/76 | HR 61 | Resp 16 | Ht 63.0 in | Wt 200.8 lb

## 2024-03-27 DIAGNOSIS — E1169 Type 2 diabetes mellitus with other specified complication: Secondary | ICD-10-CM

## 2024-03-27 DIAGNOSIS — J3089 Other allergic rhinitis: Secondary | ICD-10-CM

## 2024-03-27 DIAGNOSIS — E785 Hyperlipidemia, unspecified: Secondary | ICD-10-CM | POA: Diagnosis not present

## 2024-03-27 DIAGNOSIS — E119 Type 2 diabetes mellitus without complications: Secondary | ICD-10-CM

## 2024-03-27 DIAGNOSIS — M064 Inflammatory polyarthropathy: Secondary | ICD-10-CM

## 2024-03-27 DIAGNOSIS — E890 Postprocedural hypothyroidism: Secondary | ICD-10-CM

## 2024-03-27 DIAGNOSIS — Z23 Encounter for immunization: Secondary | ICD-10-CM

## 2024-03-27 DIAGNOSIS — K219 Gastro-esophageal reflux disease without esophagitis: Secondary | ICD-10-CM

## 2024-03-27 DIAGNOSIS — E1129 Type 2 diabetes mellitus with other diabetic kidney complication: Secondary | ICD-10-CM | POA: Diagnosis not present

## 2024-03-27 DIAGNOSIS — J302 Other seasonal allergic rhinitis: Secondary | ICD-10-CM

## 2024-03-27 DIAGNOSIS — I10 Essential (primary) hypertension: Secondary | ICD-10-CM | POA: Diagnosis not present

## 2024-03-27 DIAGNOSIS — E66812 Obesity, class 2: Secondary | ICD-10-CM

## 2024-03-27 DIAGNOSIS — Z7984 Long term (current) use of oral hypoglycemic drugs: Secondary | ICD-10-CM

## 2024-03-27 DIAGNOSIS — F325 Major depressive disorder, single episode, in full remission: Secondary | ICD-10-CM | POA: Diagnosis not present

## 2024-03-27 LAB — POCT GLYCOSYLATED HEMOGLOBIN (HGB A1C): Hemoglobin A1C: 8.3 % — AB (ref 4.0–5.6)

## 2024-03-27 MED ORDER — ATORVASTATIN CALCIUM 10 MG PO TABS: 10.0000 mg | ORAL_TABLET | Freq: Every day | ORAL | 1 refills | Status: AC

## 2024-03-27 MED ORDER — METOPROLOL SUCCINATE ER 50 MG PO TB24: ORAL_TABLET | ORAL | 1 refills | Status: AC

## 2024-03-27 MED ORDER — METFORMIN HCL ER 750 MG PO TB24: 1500.0000 mg | ORAL_TABLET | Freq: Every day | ORAL | 1 refills | Status: AC

## 2024-03-27 MED ORDER — AMLODIPINE-OLMESARTAN 10-40 MG PO TABS: 1.0000 | ORAL_TABLET | Freq: Every day | ORAL | 1 refills | Status: AC

## 2024-03-27 NOTE — Progress Notes (Signed)
 Name: Shelia Vasquez   MRN: [id number]    DOB: [birth year]   Date:03/27/2024       Progress Note  Subjective  Chief Complaint  Chief Complaint  Patient presents with   Medical Management of Chronic Issues   Discussed the use of AI scribe software for clinical note transcription with the patient, who gave verbal consent to proceed.  History of Present Illness Shelia Vasquez is a 58 year old female with type 2 diabetes who presents for a diabetes follow-up.  Her last recorded A1c was 8.4%, and today it is 8.3%. She is currently taking metformin  XR  750 mg two daily, pioglitazone  15 mg, and glipizide  2.5 mg. However, she uses pioglitazone  and glipizide  inconsistently due to concerns about side effects, specifically heart problems. She primarily takes metformin  daily. She has increased thirst and urination.  She has a history of inflammatory polyarthritis, previously managed with Plaquenil and methotrexate. She experiences occasional flare-ups, which she manages with over-the-counter medications like Aleve. She last saw Dr. Tobie for this condition in 2024.  Hypertension is managed with amlodipine  and olmesartan  10/40 mg daily, and she takes atorvastatin  for dyslipidemia. No side effects such as chest pain, palpitations, or muscle aches are reported.  She has hypothyroidism post-ablative therapy and is currently taking levothyroxine  112 mcg daily with a half dose on Sundays. She experiences occasional constipation, managed with Miralax.  She experiences perennial allergic rhinitis with seasonal variation, using over-the-counter Flonase  as needed. No current symptoms of heartburn or indigestion related to reflux.  Her social history includes no consumption of sweet beverages, sodas, or sweet tea. She has not participated in weight management programs like Weight Watchers.    Patient Active Problem List   Diagnosis Date Noted   Hyperparathyroidism 09/25/2023   Morbid obesity (HCC)  09/25/2023   Perennial allergic rhinitis with seasonal variation 09/25/2023   Type 2 diabetes mellitus with microalbuminuria, without long-term current use of insulin (HCC) 09/25/2023   Inflammatory polyarthritis (HCC) 08/31/2020   Positive ANA (antinuclear antibody) 07/26/2017   Benign hypertension 02/06/2015   Dyslipidemia due to type 2 diabetes mellitus (HCC) 02/06/2015   Gastro-esophageal reflux disease without esophagitis 02/06/2015   History of radioactive iodine thyroid  ablation 02/06/2015   Hypothyroidism, postablative 02/06/2015   History of iron deficiency anemia 02/06/2015   Obesity (BMI 30.0-34.9) 02/06/2015   H/O: hysterectomy 02/06/2015   Vitamin D  deficiency 02/06/2015   Seasonal allergic rhinitis 02/06/2015   History of uterine fibroid 05/29/2013    Past Surgical History:  Procedure Laterality Date   ABDOMINAL HYSTERECTOMY     BTL     CESAREAN SECTION     COLONOSCOPY WITH PROPOFOL  N/A 03/07/2017   Procedure: COLONOSCOPY WITH PROPOFOL ;  Surgeon: Jinny Carmine, MD;  Location: [facility] ENDOSCOPY;  Service: Endoscopy;  Laterality: N/A;   TUBAL LIGATION      Family History  Problem Relation Age of Onset   Hypertension Mother    Kidney disease Mother    Arthritis Father    COPD Father    Diabetes Father    Heart disease Father    Hypertension Father    Autism Daughter    Anxiety disorder Daughter    Asthma Daughter    Intellectual disability Daughter    Learning disabilities Daughter    Breast cancer Neg Hx     Social History   Tobacco Use   Smoking status: Never   Smokeless tobacco: Never  Substance Use Topics   Alcohol use: No  Current Outpatient Medications:    estradiol  (ESTRACE ) 0.1 MG/GM vaginal cream, Place 1 Applicatorful vaginally at bedtime. For 2 weeks after that twice a week, Disp: 42.5 g, Rfl: 12   fluticasone  (FLONASE ) 50 MCG/ACT nasal spray, Place 2 sprays into both nostrils daily., Disp: 48 g, Rfl: 1   glipiZIDE  (GLUCOTROL  XL) 2.5 MG  24 hr tablet, TAKE 1 TO 2 TABLETS BY MOUTH EVERY DAY WITH BREAKFAST, Disp: 180 tablet, Rfl: 0   levothyroxine  (SYNTHROID ) 112 MCG tablet, Take 112 mcg by mouth daily before breakfast. And only half on Sundays, Disp: , Rfl:    pioglitazone  (ACTOS ) 15 MG tablet, Take 1 tablet (15 mg total) by mouth daily., Disp: 90 tablet, Rfl: 1   Vitamin D , Ergocalciferol , (DRISDOL ) 1.25 MG (50000 UNIT) CAPS capsule, Take 50,000 Units by mouth 2 (two) times a week., Disp: , Rfl:    amLODipine -olmesartan  (AZOR ) 10-40 MG tablet, Take 1 tablet by mouth daily. In place of Olmesartan  and Amlodipine  for BP, Disp: 90 tablet, Rfl: 1   atorvastatin  (LIPITOR) 10 MG tablet, Take 1 tablet (10 mg total) by mouth daily., Disp: 90 tablet, Rfl: 1   metFORMIN  (GLUCOPHAGE -XR) 750 MG 24 hr tablet, Take 2 tablets (1,500 mg total) by mouth daily with breakfast., Disp: 180 tablet, Rfl: 1   metoprolol  succinate (TOPROL -XL) 50 MG 24 hr tablet, TAKE 1 TABLET BY MOUTH EVERY DAY WITH OR IMMEDIATELY FOLLOWING A MEAL, Disp: 90 tablet, Rfl: 1  No Known Allergies  I personally reviewed active problem list, medication list, allergies, family history with the patient/caregiver today.   ROS  Ten systems reviewed and is negative except as mentioned in HPI    Objective Physical Exam  CONSTITUTIONAL: Patient appears well-developed and well-nourished. No distress. HEENT: Head atraumatic, normocephalic, neck supple. CARDIOVASCULAR: Normal rate, regular rhythm, and normal heart sounds. No murmur heard. No edema in extremities. PULMONARY: Effort normal and breath sounds normal. No respiratory distress. ABDOMINAL: There is no tenderness or distention. MUSCULOSKELETAL: Normal gait. Without gross motor or sensory deficit. PSYCHIATRIC: Patient has a normal mood and affect. Behavior is normal. Judgment and thought content normal.  Vitals:   03/27/24 0914  BP: 126/76  Pulse: 61  Resp: 16  SpO2: 99%  Weight: 200 lb 12.8 oz (91.1 kg)  Height: 5'  3 (1.6 m)    Body mass index is 35.57 kg/m.  Recent Results (from the past 2160 hours)  POCT glycosylated hemoglobin (Hb A1C)     Status: Abnormal   Collection Time: 03/27/24  9:28 AM  Result Value Ref Range   Hemoglobin A1C 8.3 (A) 4.0 - 5.6 %   HbA1c POC (<> result, manual entry)     HbA1c, POC (prediabetic range)     HbA1c, POC (controlled diabetic range)      Diabetic Foot Exam:     PHQ2/9:    03/27/2024    9:08 AM 12/27/2023    9:22 AM 12/06/2023    9:02 AM 09/25/2023    8:49 AM 05/30/2023    8:13 AM  Depression screen PHQ 2/9  Decreased Interest 0 0 0 0 0  Down, Depressed, Hopeless 0 0 0 0 0  PHQ - 2 Score 0 0 0 0 0  Altered sleeping 0 0  0 0  Tired, decreased energy 0 0  0 0  Change in appetite 0 0  0 0  Feeling bad or failure about yourself  0 0  0 0  Trouble concentrating 0 0  0 0  Moving  slowly or fidgety/restless 0 0  0 0  Suicidal thoughts 0 0  0 0  PHQ-9 Score 0 0   0  0   Difficult doing work/chores Not difficult at all Not difficult at all  Not difficult at all Not difficult at all     Data saved with a previous flowsheet row definition    phq 9 is negative  Fall Risk:    03/27/2024    9:08 AM 12/27/2023    9:22 AM 12/06/2023    9:02 AM 09/25/2023    8:43 AM 05/30/2023    8:13 AM  Fall Risk   Falls in the past year? 0 0 0 0 0  Number falls in past yr: 0 0 0 0 0  Injury with Fall? 0 0  0  0  0   Risk for fall due to : No Fall Risks No Fall Risks No Fall Risks No Fall Risks No Fall Risks  Follow up Falls evaluation completed Falls evaluation completed Falls evaluation completed Falls prevention discussed;Education provided;Falls evaluation completed Falls prevention discussed;Education provided;Falls evaluation completed     Data saved with a previous flowsheet row definition    Assessment & Plan Type 2 diabetes mellitus, uncontrolled with associated HTN, dyslipidemia, obesity A1c at 8.3 indicates poor glycemic control. Discussed medication side effects  and emphasized adherence to prevent complications. Encouraged lifestyle modifications for better control. - Continue metformin  750 mg twice daily. - Continue glipizide  2.5 mg once daily, option to increase to 5 mg on dietary indulgence days. - Continue pioglitazone  15 mg daily. - Encouraged 5-pound weight loss by next visit. - Discussed potential referral to Weight [employer].  Morbid Obesity BMI above 35 with co morbidities such as  diabetes, and hypertension. Emphasized weight loss to improve health and reduce complication risks. - Encouraged 5-pound weight loss by next visit. - Discussed potential referral to [employer].  Hypertension Managed with amlodipine  and olmesartan . No side effects reported. Blood pressure control crucial for diabetes and kidney health. - Continue amlodipine  and olmesartan  as prescribed.  Dyslipidemia Managed with atorvastatin . LDL slightly elevated at 70. No side effects reported. - Continue atorvastatin  as prescribed.  Chronic kidney disease stage 1/DM with microalbuminuria  Microalbuminuria normalized due to effective blood pressure management. - Continue current blood pressure management regimen.  Postprocedural hypothyroidism Managed by Dr. Cherilyn. Recent TSH slightly suppressed, dose of levothyroxine  adjusted. No significant symptoms. - Continue levothyroxine  112 mcg daily with an additional half dose on Sundays.  Inflammatory polyarthropathy, currently controlled Controlled with occasional flare-ups managed with OTC medications. Previous treatments discontinued. - Continue managing flare-ups with OTC medications as needed. - Consider referral to Dr. Tobie if symptoms worsen.  Allergic rhinitis Symptoms intermittent, managed with OTC nasal spray. - Continue using OTC nasal spray as needed.

## 2024-06-26 ENCOUNTER — Ambulatory Visit: Admitting: Family Medicine

## 2024-12-09 ENCOUNTER — Encounter: Admitting: Family Medicine
